# Patient Record
Sex: Female | Born: 1935 | Race: Black or African American | Hispanic: No | Smoking: Former smoker
Health system: Southern US, Community
[De-identification: ages and names within clinical notes are randomized; demographics above are authoritative.]

## PROBLEM LIST (undated history)

## (undated) DIAGNOSIS — I10 Essential (primary) hypertension: Secondary | ICD-10-CM

## (undated) DIAGNOSIS — G20A1 Parkinson's disease without dyskinesia, without mention of fluctuations: Secondary | ICD-10-CM

## (undated) DIAGNOSIS — M1990 Unspecified osteoarthritis, unspecified site: Secondary | ICD-10-CM

## (undated) DIAGNOSIS — C8590 Non-Hodgkin lymphoma, unspecified, unspecified site: Secondary | ICD-10-CM

## (undated) DIAGNOSIS — G20 Parkinson's disease: Secondary | ICD-10-CM

## (undated) DIAGNOSIS — C801 Malignant (primary) neoplasm, unspecified: Secondary | ICD-10-CM

---

## 2003-12-05 ENCOUNTER — Encounter: Admission: RE | Admit: 2003-12-05 | Discharge: 2003-12-05 | Payer: Self-pay | Admitting: Internal Medicine

## 2003-12-10 ENCOUNTER — Encounter: Admission: RE | Admit: 2003-12-10 | Discharge: 2003-12-10 | Payer: Self-pay | Admitting: Internal Medicine

## 2004-11-04 ENCOUNTER — Ambulatory Visit: Payer: Self-pay | Admitting: Internal Medicine

## 2004-12-24 ENCOUNTER — Ambulatory Visit: Payer: Self-pay | Admitting: Gastroenterology

## 2005-05-09 ENCOUNTER — Emergency Department: Payer: Self-pay | Admitting: Emergency Medicine

## 2005-05-30 ENCOUNTER — Ambulatory Visit: Payer: Self-pay | Admitting: *Deleted

## 2005-07-12 ENCOUNTER — Ambulatory Visit: Payer: Self-pay | Admitting: Ophthalmology

## 2005-07-27 ENCOUNTER — Ambulatory Visit: Payer: Self-pay

## 2005-11-15 ENCOUNTER — Ambulatory Visit: Payer: Self-pay | Admitting: Internal Medicine

## 2005-11-29 ENCOUNTER — Ambulatory Visit: Payer: Self-pay | Admitting: Internal Medicine

## 2005-12-07 ENCOUNTER — Ambulatory Visit: Payer: Self-pay | Admitting: Internal Medicine

## 2006-01-25 ENCOUNTER — Ambulatory Visit: Payer: Self-pay | Admitting: Anesthesiology

## 2006-02-14 ENCOUNTER — Ambulatory Visit: Payer: Self-pay | Admitting: Anesthesiology

## 2006-03-14 ENCOUNTER — Ambulatory Visit: Payer: Self-pay | Admitting: Anesthesiology

## 2006-04-17 ENCOUNTER — Ambulatory Visit: Payer: Self-pay | Admitting: Anesthesiology

## 2006-05-31 ENCOUNTER — Ambulatory Visit: Payer: Self-pay | Admitting: Anesthesiology

## 2006-06-26 ENCOUNTER — Ambulatory Visit: Payer: Self-pay | Admitting: Anesthesiology

## 2006-08-17 ENCOUNTER — Ambulatory Visit: Payer: Self-pay | Admitting: Anesthesiology

## 2006-09-26 ENCOUNTER — Ambulatory Visit: Payer: Self-pay | Admitting: Anesthesiology

## 2006-11-17 ENCOUNTER — Ambulatory Visit: Payer: Self-pay | Admitting: Internal Medicine

## 2007-04-25 ENCOUNTER — Emergency Department: Payer: Self-pay | Admitting: Emergency Medicine

## 2007-04-25 ENCOUNTER — Other Ambulatory Visit: Payer: Self-pay

## 2007-05-11 ENCOUNTER — Ambulatory Visit: Payer: Self-pay | Admitting: Internal Medicine

## 2007-08-07 ENCOUNTER — Ambulatory Visit: Payer: Self-pay | Admitting: Vascular Surgery

## 2007-09-25 ENCOUNTER — Ambulatory Visit: Payer: Self-pay | Admitting: Vascular Surgery

## 2007-11-20 ENCOUNTER — Ambulatory Visit: Payer: Self-pay | Admitting: Internal Medicine

## 2007-12-20 ENCOUNTER — Ambulatory Visit: Payer: Self-pay | Admitting: Pain Medicine

## 2007-12-26 ENCOUNTER — Ambulatory Visit: Payer: Self-pay | Admitting: Pain Medicine

## 2008-01-21 ENCOUNTER — Ambulatory Visit: Payer: Self-pay | Admitting: Pain Medicine

## 2008-02-19 ENCOUNTER — Ambulatory Visit: Payer: Self-pay | Admitting: Pain Medicine

## 2008-03-18 ENCOUNTER — Ambulatory Visit: Payer: Self-pay | Admitting: Pain Medicine

## 2008-04-07 ENCOUNTER — Ambulatory Visit: Payer: Self-pay | Admitting: Internal Medicine

## 2008-04-15 ENCOUNTER — Ambulatory Visit: Payer: Self-pay | Admitting: Pain Medicine

## 2008-04-21 ENCOUNTER — Ambulatory Visit: Payer: Self-pay | Admitting: Pain Medicine

## 2008-05-27 ENCOUNTER — Ambulatory Visit: Payer: Self-pay | Admitting: Pain Medicine

## 2008-06-02 ENCOUNTER — Ambulatory Visit: Payer: Self-pay | Admitting: Pain Medicine

## 2008-07-01 ENCOUNTER — Ambulatory Visit: Payer: Self-pay | Admitting: Pain Medicine

## 2008-07-07 ENCOUNTER — Ambulatory Visit: Payer: Self-pay | Admitting: Pain Medicine

## 2008-07-17 ENCOUNTER — Ambulatory Visit: Payer: Self-pay | Admitting: Gastroenterology

## 2008-07-24 ENCOUNTER — Ambulatory Visit: Payer: Self-pay | Admitting: Pain Medicine

## 2008-08-23 ENCOUNTER — Emergency Department: Payer: Self-pay | Admitting: Internal Medicine

## 2008-10-29 ENCOUNTER — Ambulatory Visit: Payer: Self-pay | Admitting: Pain Medicine

## 2008-11-12 ENCOUNTER — Ambulatory Visit: Payer: Self-pay | Admitting: Pain Medicine

## 2008-11-20 ENCOUNTER — Ambulatory Visit: Payer: Self-pay | Admitting: Internal Medicine

## 2008-12-17 ENCOUNTER — Ambulatory Visit: Payer: Self-pay | Admitting: Pain Medicine

## 2009-01-20 ENCOUNTER — Ambulatory Visit: Payer: Self-pay | Admitting: Pain Medicine

## 2009-01-28 ENCOUNTER — Ambulatory Visit: Payer: Self-pay | Admitting: Pain Medicine

## 2009-02-06 ENCOUNTER — Ambulatory Visit: Payer: Self-pay | Admitting: Internal Medicine

## 2009-03-03 ENCOUNTER — Ambulatory Visit: Payer: Self-pay | Admitting: Pain Medicine

## 2009-03-16 ENCOUNTER — Ambulatory Visit: Payer: Self-pay | Admitting: Pain Medicine

## 2009-03-18 ENCOUNTER — Ambulatory Visit: Payer: Self-pay | Admitting: Pain Medicine

## 2009-04-21 ENCOUNTER — Ambulatory Visit: Payer: Self-pay | Admitting: Pain Medicine

## 2009-04-27 ENCOUNTER — Ambulatory Visit: Payer: Self-pay | Admitting: Pain Medicine

## 2009-05-26 ENCOUNTER — Ambulatory Visit: Payer: Self-pay | Admitting: Pain Medicine

## 2009-06-30 ENCOUNTER — Ambulatory Visit: Payer: Self-pay | Admitting: Pain Medicine

## 2009-07-08 ENCOUNTER — Ambulatory Visit: Payer: Self-pay | Admitting: Pain Medicine

## 2009-07-17 ENCOUNTER — Encounter: Payer: Self-pay | Admitting: Neurology

## 2009-07-21 ENCOUNTER — Encounter: Payer: Self-pay | Admitting: Neurology

## 2009-08-11 ENCOUNTER — Ambulatory Visit: Payer: Self-pay | Admitting: Pain Medicine

## 2009-08-12 ENCOUNTER — Ambulatory Visit: Payer: Self-pay | Admitting: Neurology

## 2009-08-21 ENCOUNTER — Encounter: Payer: Self-pay | Admitting: Neurology

## 2009-09-08 ENCOUNTER — Ambulatory Visit: Payer: Self-pay | Admitting: Pain Medicine

## 2009-09-16 ENCOUNTER — Ambulatory Visit: Payer: Self-pay | Admitting: Neurology

## 2009-10-06 ENCOUNTER — Ambulatory Visit: Payer: Self-pay | Admitting: Pain Medicine

## 2009-11-24 ENCOUNTER — Ambulatory Visit: Payer: Self-pay | Admitting: Internal Medicine

## 2009-11-30 ENCOUNTER — Ambulatory Visit: Payer: Self-pay | Admitting: Pain Medicine

## 2010-01-05 ENCOUNTER — Ambulatory Visit: Payer: Self-pay | Admitting: Pain Medicine

## 2010-01-31 ENCOUNTER — Inpatient Hospital Stay: Payer: Self-pay | Admitting: Internal Medicine

## 2010-05-01 ENCOUNTER — Emergency Department: Payer: Self-pay | Admitting: Emergency Medicine

## 2010-11-09 ENCOUNTER — Ambulatory Visit: Payer: Self-pay | Admitting: Rheumatology

## 2010-12-07 ENCOUNTER — Ambulatory Visit: Payer: Self-pay | Admitting: Internal Medicine

## 2010-12-11 ENCOUNTER — Ambulatory Visit: Payer: Self-pay | Admitting: Otolaryngology

## 2010-12-27 ENCOUNTER — Encounter: Payer: Self-pay | Admitting: Otolaryngology

## 2011-01-22 ENCOUNTER — Encounter: Payer: Self-pay | Admitting: Otolaryngology

## 2011-02-21 ENCOUNTER — Encounter: Payer: Self-pay | Admitting: Otolaryngology

## 2011-03-24 ENCOUNTER — Encounter: Payer: Self-pay | Admitting: Otolaryngology

## 2011-04-23 ENCOUNTER — Encounter: Payer: Self-pay | Admitting: Otolaryngology

## 2011-05-24 ENCOUNTER — Encounter: Payer: Self-pay | Admitting: Otolaryngology

## 2011-12-08 ENCOUNTER — Ambulatory Visit: Payer: Self-pay | Admitting: Internal Medicine

## 2012-12-11 ENCOUNTER — Ambulatory Visit: Payer: Self-pay | Admitting: Internal Medicine

## 2013-05-15 ENCOUNTER — Encounter: Payer: Self-pay | Admitting: Family Medicine

## 2013-12-25 ENCOUNTER — Ambulatory Visit: Payer: Self-pay | Admitting: Family Medicine

## 2014-10-22 ENCOUNTER — Other Ambulatory Visit: Payer: Self-pay | Admitting: Family Medicine

## 2014-10-22 DIAGNOSIS — Z1231 Encounter for screening mammogram for malignant neoplasm of breast: Secondary | ICD-10-CM

## 2014-10-26 ENCOUNTER — Emergency Department: Payer: Medicare Other

## 2014-10-26 ENCOUNTER — Encounter: Payer: Self-pay | Admitting: Emergency Medicine

## 2014-10-26 ENCOUNTER — Emergency Department
Admission: EM | Admit: 2014-10-26 | Discharge: 2014-10-26 | Disposition: A | Discharge status: Home / Self Care | Payer: Medicare Other | Attending: Emergency Medicine | Admitting: Emergency Medicine

## 2014-10-26 DIAGNOSIS — Y9222 Religious institution as the place of occurrence of the external cause: Secondary | ICD-10-CM | POA: Diagnosis not present

## 2014-10-26 DIAGNOSIS — Y998 Other external cause status: Secondary | ICD-10-CM | POA: Insufficient documentation

## 2014-10-26 DIAGNOSIS — S0083XA Contusion of other part of head, initial encounter: Secondary | ICD-10-CM | POA: Diagnosis not present

## 2014-10-26 DIAGNOSIS — Y9389 Activity, other specified: Secondary | ICD-10-CM | POA: Insufficient documentation

## 2014-10-26 DIAGNOSIS — I10 Essential (primary) hypertension: Secondary | ICD-10-CM | POA: Insufficient documentation

## 2014-10-26 DIAGNOSIS — W01198A Fall on same level from slipping, tripping and stumbling with subsequent striking against other object, initial encounter: Secondary | ICD-10-CM | POA: Insufficient documentation

## 2014-10-26 DIAGNOSIS — S199XXA Unspecified injury of neck, initial encounter: Secondary | ICD-10-CM | POA: Insufficient documentation

## 2014-10-26 DIAGNOSIS — S0990XA Unspecified injury of head, initial encounter: Secondary | ICD-10-CM | POA: Diagnosis present

## 2014-10-26 DIAGNOSIS — S0093XA Contusion of unspecified part of head, initial encounter: Secondary | ICD-10-CM

## 2014-10-26 HISTORY — DX: Parkinson's disease: G20

## 2014-10-26 HISTORY — DX: Parkinson's disease without dyskinesia, without mention of fluctuations: G20.A1

## 2014-10-26 HISTORY — DX: Essential (primary) hypertension: I10

## 2014-10-26 NOTE — ED Provider Notes (Signed)
[facility] Emergency Department Provider Note  ____________________________________________  Time seen: Approximately 5:14 PM  I have reviewed the triage vital signs and the nursing notes.   HISTORY  Chief Complaint Fall    HPI Isabel Paisly Fingerhut is a 81 y.o. female who presents to the ER for evaluation of the head and neck injury after falling in church this morning. Patient reports no loss of consciousness however she she states that when she fell she hit the side of her head and neck hard on the wall. Denies any numbness tingling. No visual changes. No nausea vomiting. Eyes any dizziness.   Past Medical History  Diagnosis Date  . Hypertension   . Parkinson's disease     There are no active problems to display for this patient.   History reviewed. No pertinent past surgical history.  No current outpatient prescriptions on file.  Allergies Review of patient's allergies indicates no known allergies.  No family history on file.  Social History History  Substance Use Topics  . Smoking status: Never Smoker   . Smokeless tobacco: Not on file  . Alcohol Use: No    Review of Systems Constitutional: No fever/chills Eyes: No visual changes. ENT: No sore throat. Cardiovascular: Denies chest pain. Respiratory: Denies shortness of breath. Gastrointestinal: No abdominal pain.  No nausea, no vomiting.  No diarrhea.  No constipation. Genitourinary: Negative for dysuria. Musculoskeletal: Negative for back pain. Skin: Negative for rash. Neurological: Negative for headaches, focal weakness or numbness.   10-point ROS otherwise negative.  ____________________________________________   PHYSICAL EXAM:  VITAL SIGNS: ED Triage Vitals  Enc Vitals Group     BP 10/26/14 1457 133/60 mmHg     Pulse Rate 10/26/14 1457 83     Resp 10/26/14 1457 18     Temp 10/26/14 1457 98 F (36.7 C)     Temp Source 10/26/14 1457 Oral     SpO2 10/26/14 1457 96 %      Weight 10/26/14 1457 165 lb (74.844 kg)     Height 10/26/14 1457 5\' 3"  (1.6 m)     Head Cir --      Peak Flow --      Pain Score 10/26/14 1458 5     Pain Loc --      Pain Edu? --      Excl. in [place]? --     Constitutional: Alert and oriented. Well appearing and in no acute distress. Eyes: Conjunctivae are normal. PERRL. EOMI. Head: Atraumatic. Nose: No congestion/rhinnorhea. Mouth/Throat: Mucous membranes are moist.  Oropharynx non-erythematous. Neck: No stridor.  No cervical spine tenderness. Cardiovascular: Normal rate, regular rhythm. Grossly normal heart sounds.  Good peripheral circulation. Respiratory: Normal respiratory effort.  No retractions. Lungs CTAB. Gastrointestinal: Soft and nontender. No distention. No abdominal bruits. No CVA tenderness. Musculoskeletal: No lower extremity tenderness nor edema.  No joint effusions. Neurologic:  Normal speech and language. No gross focal neurologic deficits are appreciated. Speech is normal. No gait instability. Skin:  Skin is warm, dry and intact. No rash noted. Psychiatric: Mood and affect are normal. Speech and behavior are normal.  ____________________________________________   LABS (all labs ordered are listed, but only abnormal results are displayed)  Labs Reviewed - No data to display ____________________________________________  EKG  Not applicable ____________________________________________  RADIOLOGY  Head CT and cervical CT negative. ____________________________________________   PROCEDURES  Procedure(s) performed: None  Critical Care performed: No  ____________________________________________   INITIAL IMPRESSION / ASSESSMENT AND PLAN / ED COURSE  Pertinent labs & imaging results that were available during my care of the patient were reviewed by me and considered in my medical decision making (see chart for details).  Discussed clinical findings with the patient, provided reassurance based on  radiological findings. She voices no other emergency medical complaints at this time. She has remained stable during the entire ER stay. Will discharge patient home to follow-up with her PCP. Patient understands to return if symptoms worsen. ____________________________________________   FINAL CLINICAL IMPRESSION(S) / ED DIAGNOSES  Final diagnoses:  None      Arlyss Repress, PA-C 10/26/14 1841  Orbie Pyo, MD 10/26/14 [phone]

## 2014-10-26 NOTE — Discharge Instructions (Signed)

## 2014-10-26 NOTE — ED Notes (Signed)
Pt states she fell while at church today. Pt states she has a hx of falling. Pt states she fell against a wall and hit the left side of head. No loc.

## 2014-10-26 NOTE — ED Notes (Signed)
Pt states she fell this am at church and hit the left side of head. No laceration noted.  No distress noted.

## 2014-12-29 ENCOUNTER — Ambulatory Visit: Payer: Self-pay | Attending: Family Medicine

## 2015-07-21 ENCOUNTER — Encounter: Payer: Self-pay | Admitting: Emergency Medicine

## 2015-07-21 ENCOUNTER — Emergency Department
Admission: EM | Admit: 2015-07-21 | Discharge: 2015-07-21 | Disposition: A | Discharge status: Home / Self Care | Payer: Medicare Other | Attending: Emergency Medicine | Admitting: Emergency Medicine

## 2015-07-21 DIAGNOSIS — M5431 Sciatica, right side: Secondary | ICD-10-CM | POA: Insufficient documentation

## 2015-07-21 DIAGNOSIS — I10 Essential (primary) hypertension: Secondary | ICD-10-CM | POA: Diagnosis not present

## 2015-07-21 DIAGNOSIS — M25551 Pain in right hip: Secondary | ICD-10-CM | POA: Diagnosis present

## 2015-07-21 DIAGNOSIS — G8929 Other chronic pain: Secondary | ICD-10-CM | POA: Diagnosis not present

## 2015-07-21 MED ORDER — HYDROCODONE-ACETAMINOPHEN 5-325 MG PO TABS
1.0000 | ORAL_TABLET | ORAL | Status: DC | PRN
Start: 2015-07-21 — End: 2017-08-21

## 2015-07-21 NOTE — ED Provider Notes (Signed)
[facility] Emergency Department Provider Note  ____________________________________________  Time seen: On arrival  I have reviewed the triage vital signs and the nursing notes.   HISTORY  Chief Complaint Hip Pain and Back Pain    HPI Susan Villegas is a 82 y.o. female who presents with complaints of chronic back pain with radiation into the right hip and leg. Patient reports she has chronic back pain and every once in a while she develops pain that shoots into her right leg. This is been occurring over the last several days. She has seen pain management in the past but was not happy with the injections. She said the only thing that works for her when this happens is "strong pain medication ". She denies muscle weakness. No fevers. No injury. She ambulates with a cane    Past Medical History  Diagnosis Date  . Hypertension   . Parkinson's disease ([place])     There are no active problems to display for this patient.   History reviewed. No pertinent past surgical history.  Current Outpatient Rx  Name  Route  Sig  Dispense  Refill  . HYDROcodone-acetaminophen (NORCO/VICODIN) 5-325 MG tablet   Oral   Take 1 tablet by mouth every 4 (four) hours as needed for moderate pain.   20 tablet   0     Allergies Review of patient's allergies indicates no known allergies.  No family history on file.  Social History Social History  Substance Use Topics  . Smoking status: Never Smoker   . Smokeless tobacco: None  . Alcohol Use: No    Review of Systems  Constitutional: Negative for fever.  ENT: Negative for neck pain   Genitourinary: Negative for incontinence Musculoskeletal: As above Skin: Negative for rash. Neurological: Negative for headaches   ____________________________________________   PHYSICAL EXAM:  VITAL SIGNS: ED Triage Vitals  Enc Vitals Group     BP 07/21/15 1108 133/64 mmHg     Pulse Rate 07/21/15 1108 91     Resp  07/21/15 1108 16     Temp 07/21/15 1108 98.1 F (36.7 C)     Temp Source 07/21/15 1108 Oral     SpO2 07/21/15 1108 97 %     Weight 07/21/15 1108 170 lb (77.111 kg)     Height 07/21/15 1108 5\' 2"  (1.575 m)     Head Cir --      Peak Flow --      Pain Score 07/21/15 1110 0     Pain Loc --      Pain Edu? --      Excl. in [place]? --      Constitutional: Alert and oriented. Well appearing and in no distress. Eyes: Conjunctivae are normal.  ENT   Head: Normocephalic and atraumatic.   Mouth/Throat: Mucous membranes are moist. Cardiovascular: Normal rate, regular rhythm.  Respiratory: Normal respiratory effort without tachypnea nor retractions.  Gastrointestinal: Soft and non-tender in all quadrants. No distention. There is no CVA tenderness. Musculoskeletal: No vertebral tenderness to palpation. Lumbar surgical scar from prior procedure. Patient is at baseline strength in both lower extremities Neurologic:  Normal speech and language. No gross focal neurologic deficits are appreciated. Skin:  Skin is warm, dry and intact. No rash noted. Psychiatric: Mood and affect are normal. Patient exhibits appropriate insight and judgment.  ____________________________________________    LABS (pertinent positives/negatives)  Labs Reviewed - No data to display  ____________________________________________     ____________________________________________    RADIOLOGY  I have personally reviewed any xrays that were ordered on this patient: None  ____________________________________________   PROCEDURES  Procedure(s) performed: none   ____________________________________________   INITIAL IMPRESSION / ASSESSMENT AND PLAN / ED COURSE  Pertinent labs & imaging results that were available during my care of the patient were reviewed by me and considered in my medical decision making (see chart for details).  Patient well-appearing and in no distress. She is ambulating with her  cane well. She appears to be some describing sciatic type pain. She is taking naproxen with some relief. I will prescribe Vicodin to take only as needed for severe pain. She has outpatient follow-up arranged. Return precautions discussed  ____________________________________________   FINAL CLINICAL IMPRESSION(S) / ED DIAGNOSES  Final diagnoses:  Sciatica of right side     Lavonia Drafts, MD 07/21/15 1413

## 2015-07-21 NOTE — ED Notes (Addendum)
C/o a "catch" in right hip.  States pain starts in low back and radiates to right hip and down right leg.  Onset of symptoms Sunday.  Denies injury.  States has had similar pain to right hip in the past that was a "pinched nerve".  States tried taking an Alleve yesterday, that did not help pain.  Patient tried to see PCP today and then went to Urgent Care for same complaint.  Patient states "I want a shot in my joint".

## 2015-07-21 NOTE — Discharge Instructions (Signed)

## 2016-03-14 ENCOUNTER — Other Ambulatory Visit: Payer: Self-pay | Admitting: Otolaryngology

## 2016-03-14 DIAGNOSIS — R221 Localized swelling, mass and lump, neck: Secondary | ICD-10-CM

## 2016-03-17 ENCOUNTER — Ambulatory Visit
Admission: RE | Admit: 2016-03-17 | Discharge: 2016-03-17 | Disposition: A | Discharge status: Home / Self Care | Payer: Medicare Other | Source: Ambulatory Visit | Attending: Otolaryngology | Admitting: Otolaryngology

## 2016-03-17 DIAGNOSIS — R221 Localized swelling, mass and lump, neck: Secondary | ICD-10-CM | POA: Insufficient documentation

## 2016-03-18 ENCOUNTER — Other Ambulatory Visit: Payer: Self-pay | Admitting: Otolaryngology

## 2016-03-18 DIAGNOSIS — R221 Localized swelling, mass and lump, neck: Secondary | ICD-10-CM

## 2016-03-31 ENCOUNTER — Ambulatory Visit
Admission: RE | Admit: 2016-03-31 | Discharge: 2016-03-31 | Disposition: A | Discharge status: Home / Self Care | Payer: Medicare Other | Source: Ambulatory Visit | Attending: Otolaryngology | Admitting: Otolaryngology

## 2016-03-31 DIAGNOSIS — R221 Localized swelling, mass and lump, neck: Secondary | ICD-10-CM | POA: Diagnosis present

## 2016-03-31 LAB — POCT I-STAT CREATININE: CREATININE: 1.4 mg/dL — AB (ref 0.44–1.00)

## 2016-03-31 MED ORDER — IOPAMIDOL (ISOVUE-300) INJECTION 61%
60.0000 mL | Freq: Once | INTRAVENOUS | Status: AC | PRN
  Administered 2016-03-31: 60 mL via INTRAVENOUS

## 2016-12-29 ENCOUNTER — Emergency Department: Payer: Medicare Other

## 2016-12-29 ENCOUNTER — Emergency Department
Admission: EM | Admit: 2016-12-29 | Discharge: 2016-12-29 | Disposition: A | Discharge status: Home / Self Care | Payer: Medicare Other | Attending: Student in an Organized Health Care Education/Training Program | Admitting: Student in an Organized Health Care Education/Training Program

## 2016-12-29 ENCOUNTER — Encounter: Payer: Self-pay | Admitting: Emergency Medicine

## 2016-12-29 DIAGNOSIS — G20 Parkinson's disease: Secondary | ICD-10-CM | POA: Insufficient documentation

## 2016-12-29 DIAGNOSIS — M25551 Pain in right hip: Secondary | ICD-10-CM | POA: Diagnosis not present

## 2016-12-29 DIAGNOSIS — I10 Essential (primary) hypertension: Secondary | ICD-10-CM | POA: Diagnosis not present

## 2016-12-29 HISTORY — DX: Unspecified osteoarthritis, unspecified site: M19.90

## 2016-12-29 MED ORDER — LIDOCAINE 5 % EX PTCH: 1.0000 | MEDICATED_PATCH | Freq: Two times a day (BID) | CUTANEOUS | 0 refills | Status: AC

## 2016-12-29 MED ORDER — LIDOCAINE 5 % EX PTCH
1.0000 | MEDICATED_PATCH | CUTANEOUS | Status: DC
  Administered 2016-12-29: 1 via TRANSDERMAL
  Filled 2016-12-29: qty 1

## 2016-12-29 MED ORDER — ACETAMINOPHEN 325 MG PO TABS
650.0000 mg | ORAL_TABLET | Freq: Once | ORAL | Status: AC
  Administered 2016-12-29: 650 mg via ORAL
  Filled 2016-12-29: qty 2

## 2016-12-29 NOTE — ED Triage Notes (Signed)
Pt arrived to triage in wheelchair. Pt reports shooting pain into right hip that radiates into right leg. Pt sts she has HX of arthritis in the affected hip. Pt denies injury/fall.

## 2016-12-29 NOTE — ED Provider Notes (Signed)
[facility] Emergency Department Provider Note    First MD Initiated Contact with Patient 12/29/16 [phone]     (approximate)  I have reviewed the triage vital signs and the nursing notes.   HISTORY  Chief Complaint Hip Pain    HPI Susan Villegas is a 83 y.o. female with a history of arthritis presents with chief complaint of worsening right hip pain became acutely worse this evening after the patient was getting in he bed. Denies any falls. No trauma. Has been able to ambulate using her walker. She took some Aleve at home without any improvement in her symptoms. Denies any fevers.   Past Medical History:  Diagnosis Date  . Arthritis   . Hypertension   . Parkinson's disease ([place])    History reviewed. No pertinent family history. History reviewed. No pertinent surgical history. There are no active problems to display for this patient.     Prior to Admission medications   Medication Sig Start Date End Date Taking? Authorizing Provider  HYDROcodone-acetaminophen (NORCO/VICODIN) 5-325 MG tablet Take 1 tablet by mouth every 4 (four) hours as needed for moderate pain. 07/21/15   Lavonia Drafts, MD  lidocaine (LIDODERM) 5 % Place 1 patch onto the skin every 12 (twelve) hours. Remove & Discard patch within 12 hours or as directed by MD 12/29/16 12/29/17  Merlyn Lot, MD    Allergies Patient has no known allergies.    Social History Social History  Substance Use Topics  . Smoking status: Never Smoker  . Smokeless tobacco: Never Used  . Alcohol use No    Review of Systems Patient denies headaches, rhinorrhea, blurry vision, numbness, shortness of breath, chest pain, edema, cough, abdominal pain, nausea, vomiting, diarrhea, dysuria, fevers, rashes or hallucinations unless otherwise stated above in HPI. ____________________________________________   PHYSICAL EXAM:  VITAL SIGNS: Vitals:   12/29/16 0103 12/29/16 0323  BP: 125/65 (!) 131/93   Pulse: 97 83  Resp: 17 16  Temp: 97.7 F (36.5 C)     Constitutional: Alert and oriented. Well appearing and in no acute distress. Eyes: Conjunctivae are normal.  Head: Atraumatic. Nose: No congestion/rhinnorhea. Mouth/Throat: Mucous membranes are moist.   Neck: No stridor. Painless ROM.  Cardiovascular: Normal rate, regular rhythm. Grossly normal heart sounds.  Good peripheral circulation. Respiratory: Normal respiratory effort.  No retractions. Lungs CTAB. Gastrointestinal: Soft and nontender. No distention. No abdominal bruits. No CVA tenderness. Musculoskeletal: No lower extremity tenderness nor edema.  Mild ttp over right greater trochanter.  No pain with log roll.  No pain with internal and external rotation or flexion or extension of hip.  No joint effusions. Neurologic:  Normal speech and language. No gross focal neurologic deficits are appreciated. No facial droop Skin:  Skin is warm, dry and intact. No rash noted. Psychiatric: Mood and affect are normal. Speech and behavior are normal.  ____________________________________________   LABS (all labs ordered are listed, but only abnormal results are displayed)  No results found for this or any previous visit (from the past 24 hour(s)). ____________________________________________  ____________________________________________  [id number]  I personally reviewed all radiographic images ordered to evaluate for the above acute complaints and reviewed radiology reports and findings.  These findings were personally discussed with the patient.  Please see medical record for radiology report.  ____________________________________________   PROCEDURES  Procedure(s) performed:  Procedures    Critical Care performed: no ____________________________________________   INITIAL IMPRESSION / ASSESSMENT AND PLAN / ED COURSE  Pertinent labs & imaging  results that were available during my care of the patient were reviewed by me  and considered in my medical decision making (see chart for details).  DDX: arthritis, fracture, bursitis  Susan Villegas is a 83 y.o. who presents to the ED with acute right hip pain. Denies any other injuries. Denies head injury or neck pain/injury. Denies motor or sensory loss in extremity. VSS in ED. Exam as above. NV intact throughout and distal to injury. 2+ distal pulses. Pt able to range joint, with some pain; also TTP at hip joint. No clinical suspicion for infectious process or septic joint. Getting X-rays to r/o fracture. No other injuries reported or noted on exam.  X-rays with no evidence of fracture.  Is not consistent with bursitis. Will give patient a topical analgesia and follow-up with orthopedics.       ____________________________________________   FINAL CLINICAL IMPRESSION(S) / ED DIAGNOSES  Final diagnoses:  Acute pain of right hip      NEW MEDICATIONS STARTED DURING THIS VISIT:  New Prescriptions   LIDOCAINE (LIDODERM) 5 %    Place 1 patch onto the skin every 12 (twelve) hours. Remove & Discard patch within 12 hours or as directed by MD     Note:  This document was prepared using Dragon voice recognition software and may include unintentional dictation errors.]   Merlyn Lot, MD 12/29/16 0410

## 2017-05-14 ENCOUNTER — Encounter: Payer: Self-pay | Admitting: Emergency Medicine

## 2017-05-14 ENCOUNTER — Emergency Department: Payer: Medicare Other

## 2017-05-14 ENCOUNTER — Emergency Department
Admission: EM | Admit: 2017-05-14 | Discharge: 2017-05-14 | Disposition: A | Discharge status: Home / Self Care | Payer: Medicare Other | Attending: Emergency Medicine | Admitting: Emergency Medicine

## 2017-05-14 DIAGNOSIS — W1809XA Striking against other object with subsequent fall, initial encounter: Secondary | ICD-10-CM | POA: Insufficient documentation

## 2017-05-14 DIAGNOSIS — G20 Parkinson's disease: Secondary | ICD-10-CM | POA: Diagnosis not present

## 2017-05-14 DIAGNOSIS — S0990XA Unspecified injury of head, initial encounter: Secondary | ICD-10-CM | POA: Diagnosis present

## 2017-05-14 DIAGNOSIS — E876 Hypokalemia: Secondary | ICD-10-CM | POA: Diagnosis not present

## 2017-05-14 DIAGNOSIS — I10 Essential (primary) hypertension: Secondary | ICD-10-CM | POA: Diagnosis not present

## 2017-05-14 DIAGNOSIS — R9431 Abnormal electrocardiogram [ECG] [EKG]: Secondary | ICD-10-CM | POA: Insufficient documentation

## 2017-05-14 DIAGNOSIS — Z79899 Other long term (current) drug therapy: Secondary | ICD-10-CM | POA: Insufficient documentation

## 2017-05-14 DIAGNOSIS — Y999 Unspecified external cause status: Secondary | ICD-10-CM | POA: Diagnosis not present

## 2017-05-14 DIAGNOSIS — Y9301 Activity, walking, marching and hiking: Secondary | ICD-10-CM | POA: Diagnosis not present

## 2017-05-14 DIAGNOSIS — S0083XA Contusion of other part of head, initial encounter: Secondary | ICD-10-CM | POA: Diagnosis not present

## 2017-05-14 DIAGNOSIS — W19XXXA Unspecified fall, initial encounter: Secondary | ICD-10-CM

## 2017-05-14 DIAGNOSIS — Y92009 Unspecified place in unspecified non-institutional (private) residence as the place of occurrence of the external cause: Secondary | ICD-10-CM | POA: Insufficient documentation

## 2017-05-14 LAB — BASIC METABOLIC PANEL
ANION GAP: 8 (ref 5–15)
BUN: 18 mg/dL (ref 6–20)
CALCIUM: 9 mg/dL (ref 8.9–10.3)
CO2: 25 mmol/L (ref 22–32)
Chloride: 106 mmol/L (ref 101–111)
Creatinine, Ser: 1.22 mg/dL — ABNORMAL HIGH (ref 0.44–1.00)
GFR calc non Af Amer: 38 mL/min — ABNORMAL LOW (ref >60)
GFR, EST AFRICAN AMERICAN: 44 mL/min — AB (ref >60)
Glucose, Bld: 108 mg/dL — ABNORMAL HIGH (ref 65–99)
POTASSIUM: 2.8 mmol/L — AB (ref 3.5–5.1)
Sodium: 139 mmol/L (ref 135–145)

## 2017-05-14 LAB — CBC
HEMATOCRIT: 37 % (ref 35.0–47.0)
HEMOGLOBIN: 12.1 g/dL (ref 12.0–16.0)
MCH: 30.1 pg (ref 26.0–34.0)
MCHC: 32.7 g/dL (ref 32.0–36.0)
MCV: 92.2 fL (ref 80.0–100.0)
Platelets: 331 10*3/uL (ref 150–440)
RBC: 4.01 MIL/uL (ref 3.80–5.20)
RDW: 13.8 % (ref 11.5–14.5)
WBC: 5.8 10*3/uL (ref 3.6–11.0)

## 2017-05-14 MED ORDER — POTASSIUM CHLORIDE CRYS ER 20 MEQ PO TBCR
40.0000 meq | EXTENDED_RELEASE_TABLET | Freq: Once | ORAL | Status: AC
  Administered 2017-05-14: 40 meq via ORAL
  Filled 2017-05-14: qty 2

## 2017-05-14 MED ORDER — POTASSIUM CHLORIDE ER 10 MEQ PO TBCR: 40.0000 meq | EXTENDED_RELEASE_TABLET | Freq: Once | ORAL | 0 refills | Status: DC

## 2017-05-14 NOTE — ED Triage Notes (Addendum)
Patient arrives from home with witnessed fall by patients niece.  Patient fell this morning after not sleeping much tonight.  Pt has slight hematoma on right forehead.  Patient is not on blood thinners, and reports a slight headache.  Family states the patient has a hx of dementia.

## 2017-05-14 NOTE — ED Notes (Signed)
Pt assisted out of car to wheelchair; pt says she stumbled and hit her head on a door frame; pt awake and alert

## 2017-05-14 NOTE — ED Provider Notes (Addendum)
[facility] Emergency Department Provider Note  ____________________________________________   First MD Initiated Contact with Patient 05/14/17 [phone]     (approximate)  I have reviewed the triage vital signs and the nursing notes.   HISTORY  Chief Complaint Fall    HPI Susan Villegas is a 83 y.o. female with a history of hypertension as well as Parkinson's disease who is presenting after a fall.  She says that she had gotten up in the middle the night to use the bathroom and walked into a wall and then fell backwards.  She says that the lights were off in the hallway where she was walking and this is likely where she walked into the wall.  She denies losing consciousness.  She was brought in by her son who is concerned about swelling that started after she hit her head against the wall.  The patient is not reporting any numbness or weakness.  Denies any pain in her hips or lower extremities.  Denies taking blood thinners.  Past Medical History:  Diagnosis Date  . Arthritis   . Hypertension   . Parkinson's disease ([place])     There are no active problems to display for this patient.   History reviewed. No pertinent surgical history.  Prior to Admission medications   Medication Sig Start Date End Date Taking? Authorizing Provider  HYDROcodone-acetaminophen (NORCO/VICODIN) 5-325 MG tablet Take 1 tablet by mouth every 4 (four) hours as needed for moderate pain. 07/21/15   Lavonia Drafts, MD  lidocaine (LIDODERM) 5 % Place 1 patch onto the skin every 12 (twelve) hours. Remove & Discard patch within 12 hours or as directed by MD 12/29/16 12/29/17  Merlyn Lot, MD    Allergies Patient has no known allergies.  No family history on file.  Social History Social History   Tobacco Use  . Smoking status: Never Smoker  . Smokeless tobacco: Never Used  Substance Use Topics  . Alcohol use: No  . Drug use: No    Review of Systems  Constitutional:  No fever/chills Eyes: No visual changes. ENT: No sore throat. Cardiovascular: Denies chest pain. Respiratory: Denies shortness of breath. Gastrointestinal: No abdominal pain.  No nausea, no vomiting.  No diarrhea.  No constipation. Genitourinary: Negative for dysuria. Musculoskeletal: Negative for back pain. Skin: Negative for rash. Neurological: Negative for focal weakness or numbness.   ____________________________________________   PHYSICAL EXAM:  VITAL SIGNS: ED Triage Vitals [05/14/17 0621]  Enc Vitals Group     BP (!) 142/71     Pulse Rate 90     Resp 18     Temp (!) 97.5 F (36.4 C)     Temp Source Oral     SpO2 95 %     Weight      Height      Head Circumference      Peak Flow      Pain Score      Pain Loc      Pain Edu?      Excl. in [place]?     Constitutional: Alert and oriented. Well appearing and in no acute distress. Eyes: Conjunctivae are normal.  Head: Slightly raised hematoma over the right forehead.  Hematoma measures about 4 x 6 cm.  No bogginess.  Minimal tenderness to palpation.  No depression. Nose: No congestion/rhinnorhea. Mouth/Throat: Mucous membranes are moist.  Neck: No stridor.  No tenderness to palpation to the midline cervical spine.  No deformity or step-off. Cardiovascular: Normal  rate, regular rhythm. Grossly normal heart sounds.   Respiratory: Normal respiratory effort.  No retractions. Lungs CTAB. Gastrointestinal: Soft and nontender. No distention. No CVA tenderness. Musculoskeletal: No lower extremity tenderness nor edema.  No joint effusions.  5 out of 5 strength to bilateral lower extremity's.  Pelvis is stable without tenderness to the bilateral hips.  No tenderness to palpation to the thoracic nor lumbar spines.  No deformity or step-off. Neurologic:  Normal speech and language. No gross focal neurologic deficits are appreciated. Skin:  Skin is warm, dry and intact. No rash noted. Psychiatric: Mood and affect are normal. Speech and  behavior are normal.  ____________________________________________   LABS (all labs ordered are listed, but only abnormal results are displayed)  Labs Reviewed  BASIC METABOLIC PANEL - Abnormal; Notable for the following components:      Result Value   Potassium 2.8 (*)    Glucose, Bld 108 (*)    Creatinine, Ser 1.22 (*)    GFR calc non Af Amer 38 (*)    GFR calc Af Amer 44 (*)    All other components within normal limits  CBC   ____________________________________________  EKG  ED ECG REPORT I, Doran Stabler, the attending physician, personally viewed and interpreted this ECG.   Date: 05/14/2017  EKG Time: 0632  Rate: 79  Rhythm: normal sinus rhythm  Axis: Normal axis  Intervals:Incomplete right bundle branch block.  ST&T Change: No ST segment elevation or depression.  No abnormal T wave inversion.  ED ECG REPORT I, Doran Stabler, the attending physician, personally viewed and interpreted this ECG.   Date: 05/14/2017  EKG Time: 0851  Rate: 70  Rhythm: normal sinus rhythm  Axis: Normal  Intervals:Prolonged QTC at 523  ST&T Change: No ST segment elevation or depression.  No abnormal T wave inversion.  ____________________________________________  RADIOLOGY  CT head with right frontal scalp hematoma but otherwise no acute intracranial abnormality. ____________________________________________   PROCEDURES  Procedure(s) performed:   Procedures  Critical Care performed:   ____________________________________________   INITIAL IMPRESSION / ASSESSMENT AND PLAN / ED COURSE  Pertinent labs & imaging results that were available during my care of the patient were reviewed by me and considered in my medical decision making (see chart for details).  DDX: Fall, cephalhematoma, contusion, concussion, intrarenal hemorrhage, skull fracture As part of my medical decision making, I reviewed the following data within the electronic MEDICAL RECORD NUMBER Notes from  prior ED visits  Patient without worrisome findings on lab workup nor imaging.  Hypokalemic and the patient says that she used to be on potassium supplementation.  We will give her a dose of potassium in the emergency department.  Also will refer to her primary care doctor for prolonged QTC on the EKG.  However, I do not feel like this is contributing to the patient's current symptomatology.  Explained the need for follow-up and also of the diagnosis and treatment plan.  Patient and family are understanding and willing to comply.   ----------------------------------------- 8:55 AM on 05/14/2017 -----------------------------------------  Patient with repeat EKG with improved QTC.  However, still prolonged.  Patient says that she has been having palpitations for years but is never passed out.  We will replete her potassium.  I still believe this can be treated as an outpatient as the patient is largely asymptomatic at this time and the fall was mechanical.  I explained to her the electrical abnormality that we are saying and that she was follow-up  with her primary care doctor after taking potassium at home for a day as well.  She is understanding of this plan willing to comply. ____________________________________________   FINAL CLINICAL IMPRESSION(S) / ED DIAGNOSES  Fall.  Cephalohematoma.  Prolonged QTC    NEW MEDICATIONS STARTED DURING THIS VISIT:  This SmartLink is deprecated. Use AVSMEDLIST instead to display the medication list for a patient.   Note:  This document was prepared using Dragon voice recognition software and may include unintentional dictation errors.     Orbie Pyo, MD 05/14/17 [phone]    Orbie Pyo, MD 05/14/17 [phone]  I discussed the case with Dr. Sheron Nightingale of cardiology who agrees with outpatient follow-up.  We reviewed her medication list and we agree to not change any medications at this time.    Orbie Pyo, MD 05/14/17  [phone]

## 2017-06-28 ENCOUNTER — Encounter: Payer: Self-pay | Admitting: Emergency Medicine

## 2017-06-28 ENCOUNTER — Other Ambulatory Visit: Payer: Self-pay

## 2017-06-28 ENCOUNTER — Emergency Department: Payer: Medicare Other

## 2017-06-28 DIAGNOSIS — Z79899 Other long term (current) drug therapy: Secondary | ICD-10-CM | POA: Insufficient documentation

## 2017-06-28 DIAGNOSIS — K5900 Constipation, unspecified: Secondary | ICD-10-CM | POA: Diagnosis not present

## 2017-06-28 DIAGNOSIS — E876 Hypokalemia: Secondary | ICD-10-CM | POA: Insufficient documentation

## 2017-06-28 DIAGNOSIS — G20 Parkinson's disease: Secondary | ICD-10-CM | POA: Diagnosis not present

## 2017-06-28 DIAGNOSIS — M533 Sacrococcygeal disorders, not elsewhere classified: Secondary | ICD-10-CM | POA: Diagnosis not present

## 2017-06-28 DIAGNOSIS — I10 Essential (primary) hypertension: Secondary | ICD-10-CM | POA: Diagnosis not present

## 2017-06-28 LAB — CBC WITH DIFFERENTIAL/PLATELET
BASOS ABS: 0.1 10*3/uL (ref 0–0.1)
BASOS PCT: 1 %
Eosinophils Absolute: 0.2 10*3/uL (ref 0–0.7)
Eosinophils Relative: 3 %
HEMATOCRIT: 35 % (ref 35.0–47.0)
Hemoglobin: 11.5 g/dL — ABNORMAL LOW (ref 12.0–16.0)
LYMPHS PCT: 21 %
Lymphs Abs: 1.3 10*3/uL (ref 1.0–3.6)
MCH: 28.9 pg (ref 26.0–34.0)
MCHC: 32.8 g/dL (ref 32.0–36.0)
MCV: 88 fL (ref 80.0–100.0)
Monocytes Absolute: 0.6 10*3/uL (ref 0.2–0.9)
Monocytes Relative: 10 %
NEUTROS PCT: 65 %
Neutro Abs: 4 10*3/uL (ref 1.4–6.5)
Platelets: 403 10*3/uL (ref 150–440)
RBC: 3.98 MIL/uL (ref 3.80–5.20)
RDW: 14.1 % (ref 11.5–14.5)
WBC: 6.1 10*3/uL (ref 3.6–11.0)

## 2017-06-28 LAB — COMPREHENSIVE METABOLIC PANEL
ALBUMIN: 3.5 g/dL (ref 3.5–5.0)
ALT: 8 U/L — AB (ref 14–54)
AST: 18 U/L (ref 15–41)
Alkaline Phosphatase: 70 U/L (ref 38–126)
Anion gap: 13 (ref 5–15)
BILIRUBIN TOTAL: 0.4 mg/dL (ref 0.3–1.2)
BUN: 16 mg/dL (ref 6–20)
CO2: 24 mmol/L (ref 22–32)
CREATININE: 1.04 mg/dL — AB (ref 0.44–1.00)
Calcium: 9.4 mg/dL (ref 8.9–10.3)
Chloride: 102 mmol/L (ref 101–111)
GFR calc Af Amer: 54 mL/min — ABNORMAL LOW (ref >60)
GFR, EST NON AFRICAN AMERICAN: 46 mL/min — AB (ref >60)
GLUCOSE: 136 mg/dL — AB (ref 65–99)
POTASSIUM: 3.1 mmol/L — AB (ref 3.5–5.1)
Sodium: 139 mmol/L (ref 135–145)
TOTAL PROTEIN: 8.5 g/dL — AB (ref 6.5–8.1)

## 2017-06-28 NOTE — ED Triage Notes (Signed)
Pt to triage via w/c with no distress noted; pt c/o fecal impaction with rectal pain; denies any nausea; c/o pain to left side; denies abd or back pain

## 2017-06-29 ENCOUNTER — Ambulatory Visit: Payer: Medicare Other | Admitting: Hematology and Oncology

## 2017-06-29 ENCOUNTER — Emergency Department: Payer: Medicare Other

## 2017-06-29 ENCOUNTER — Emergency Department
Admission: EM | Admit: 2017-06-29 | Discharge: 2017-06-29 | Disposition: A | Discharge status: Home / Self Care | Payer: Medicare Other | Attending: Emergency Medicine | Admitting: Emergency Medicine

## 2017-06-29 DIAGNOSIS — E876 Hypokalemia: Secondary | ICD-10-CM

## 2017-06-29 DIAGNOSIS — R222 Localized swelling, mass and lump, trunk: Secondary | ICD-10-CM

## 2017-06-29 DIAGNOSIS — K5900 Constipation, unspecified: Secondary | ICD-10-CM

## 2017-06-29 DIAGNOSIS — M533 Sacrococcygeal disorders, not elsewhere classified: Secondary | ICD-10-CM

## 2017-06-29 MED ORDER — MAGNESIUM CITRATE PO SOLN
ORAL | Status: AC
  Filled 2017-06-29: qty 296

## 2017-06-29 MED ORDER — MAGNESIUM CITRATE PO SOLN
1.0000 | Freq: Once | ORAL | Status: AC
  Administered 2017-06-29: 1 via ORAL
  Filled 2017-06-29: qty 296

## 2017-06-29 MED ORDER — POTASSIUM CHLORIDE CRYS ER 20 MEQ PO TBCR
40.0000 meq | EXTENDED_RELEASE_TABLET | Freq: Once | ORAL | Status: AC
  Administered 2017-06-29: 40 meq via ORAL

## 2017-06-29 MED ORDER — IOPAMIDOL (ISOVUE-300) INJECTION 61%
100.0000 mL | Freq: Once | INTRAVENOUS | Status: AC | PRN
  Administered 2017-06-29: 100 mL via INTRAVENOUS

## 2017-06-29 MED ORDER — IOPAMIDOL (ISOVUE-300) INJECTION 61%
30.0000 mL | Freq: Once | INTRAVENOUS | Status: AC | PRN
  Administered 2017-06-29: 30 mL via ORAL

## 2017-06-29 MED ORDER — POTASSIUM CHLORIDE CRYS ER 20 MEQ PO TBCR: 20.0000 meq | EXTENDED_RELEASE_TABLET | Freq: Every day | ORAL | 0 refills | Status: DC

## 2017-06-29 MED ORDER — DOCUSATE SODIUM 50 MG/5ML PO LIQD
100.0000 mg | ORAL | Status: AC
  Administered 2017-06-29: 100 mg via ORAL
  Filled 2017-06-29: qty 10

## 2017-06-29 MED ORDER — LACTULOSE 10 GM/15ML PO SOLN
30.0000 g | Freq: Once | ORAL | Status: AC
  Administered 2017-06-29: 30 g via ORAL
  Filled 2017-06-29: qty 60

## 2017-06-29 MED ORDER — MAGNESIUM CITRATE PO SOLN: 1.0000 | Freq: Once | ORAL | Status: DC

## 2017-06-29 NOTE — ED Provider Notes (Signed)
[facility] Emergency Department Provider Note  ____________________________________________   First MD Initiated Contact with Patient 06/29/17 [phone]     (approximate)  I have reviewed the triage vital signs and the nursing notes.   HISTORY  Chief Complaint Fecal Impaction    HPI Susan Villegas is a 84 y.o. female with medical history as listed below who presents for evaluation of constipation over the last 4 days.  It is been gradually worsening and is now severe.  She feels like she has a lot of gas and some pain in her left side from the sensation of bloating.  She has some rectal pain from time to time similar to prior episodes of constipation.  She takes Dulcolax but it is not been helping.  Her doctor said that he plans to start her on MiraLAX but she has not yet taking MiraLAX.  She has a history of constipation and has had this issue multiple times in the past but usually Dulcolax works.  She has some pain in her upper abdomen from the bloating sensation but no other abdominal pain and it comes and goes in waves like cramping.  She denies fever/chills, chest pain, shortness of breath, nausea, and vomiting.  Her last bowel movement was about 4 days ago.  Past Medical History:  Diagnosis Date  . Arthritis   . Hypertension   . Parkinson's disease ([place])     There are no active problems to display for this patient.   History reviewed. No pertinent surgical history.  Prior to Admission medications   Medication Sig Start Date End Date Taking? Authorizing Provider  lidocaine (LIDODERM) 5 % Place 1 patch onto the skin every 12 (twelve) hours. Remove & Discard patch within 12 hours or as directed by MD 12/29/16 12/29/17 Yes Merlyn Lot, MD  DULoxetine (CYMBALTA) 60 MG capsule Take 1 capsule by mouth daily. 06/13/17   [provider]  felodipine (PLENDIL) 5 MG 24 hr tablet Take 1 tablet by mouth 2 (two) times daily. 06/14/17   [provider]  HYDROcodone-acetaminophen (NORCO/VICODIN) 5-325 MG tablet Take 1 tablet by mouth every 4 (four) hours as needed for moderate pain. Patient not taking: Reported on 06/29/2017 07/21/15   Lavonia Drafts, MD  losartan (COZAAR) 25 MG tablet Take 1 tablet by mouth daily. 05/09/17   [provider]  oxybutynin (DITROPAN) 5 MG tablet Take 1 tablet by mouth 2 (two) times daily. 06/17/17   [provider]  polyethylene glycol (MIRALAX / GLYCOLAX) packet Take 17 g by mouth daily. 06/27/17   [provider]  potassium chloride (K-DUR) 10 MEQ tablet Take 4 tablets (40 mEq total) by mouth once for 1 dose. 05/14/17 05/14/17  Schaevitz, Randall An, MD  potassium chloride SA (KLOR-CON M20) 20 MEQ tablet Take 1 tablet (20 mEq total) by mouth daily. 06/29/17   Hinda Kehr, MD  SYNTHROID 150 MCG tablet Take 1 tablet by mouth daily. 06/27/17   [provider]    Allergies Patient has no known allergies.  No family history on file.  Social History Social History   Tobacco Use  . Smoking status: Never Smoker  . Smokeless tobacco: Never Used  Substance Use Topics  . Alcohol use: No  . Drug use: No    Review of Systems Constitutional: No fever/chills Eyes: No visual complaints ENT: No difficulty swallowing Cardiovascular: Denies chest pain. Respiratory: Denies shortness of breath. Gastrointestinal: Bloating, intermittent abdominal "gassy" pain, and some intermittent rectal pain and  pressure consistent with constipation.  No nausea nor vomiting Genitourinary: Negative for dysuria. Musculoskeletal: Negative for neck pain.  Negative for back pain. Integumentary: Negative for rash. Neurological: Negative for headaches, focal weakness or numbness.   ____________________________________________   PHYSICAL EXAM:  VITAL SIGNS: ED Triage Vitals  Enc Vitals Group     BP 06/28/17 2254 126/71     Pulse Rate 06/28/17 2254 (!) 105     Resp 06/28/17 2254 20      Temp 06/28/17 2254 98.6 F (37 C)     Temp Source 06/28/17 2254 Oral     SpO2 06/28/17 2254 97 %     Weight 06/28/17 2253 80.7 kg (178 lb)     Height 06/28/17 2253 1.6 m (5\' 3" )     Head Circumference --      Peak Flow --      Pain Score 06/28/17 2252 8     Pain Loc --      Pain Edu? --      Excl. in [place]? --     Constitutional: Alert and oriented. Well appearing and in no acute distress. Eyes: Conjunctivae are normal.  Head: Atraumatic. Nose: No congestion/rhinnorhea. Mouth/Throat: Mucous membranes are moist. Neck: No stridor.  No meningeal signs.   Cardiovascular: Normal rate, regular rhythm. Good peripheral circulation. Grossly normal heart sounds. Respiratory: Normal respiratory effort.  No retractions. Lungs CTAB. Gastrointestinal: Soft with some possible distention but no tenderness to palpation Rectal:  Patient declined my digital disimpaction Musculoskeletal: No lower extremity tenderness nor edema. No gross deformities of extremities. Neurologic:  Normal speech and language. No gross focal neurologic deficits are appreciated.  Skin:  Skin is warm, dry and intact. No rash noted. Psychiatric: Mood and affect are normal. Speech and behavior are normal.  ____________________________________________   LABS (all labs ordered are listed, but only abnormal results are displayed)  Labs Reviewed  CBC WITH DIFFERENTIAL/PLATELET - Abnormal; Notable for the following components:      Result Value   Hemoglobin 11.5 (*)    All other components within normal limits  COMPREHENSIVE METABOLIC PANEL - Abnormal; Notable for the following components:   Potassium 3.1 (*)    Glucose, Bld 136 (*)    Creatinine, Ser 1.04 (*)    Total Protein 8.5 (*)    ALT 8 (*)    GFR calc non Af Amer 46 (*)    GFR calc Af Amer 54 (*)    All other components within normal limits  URINALYSIS, COMPLETE (UACMP) WITH MICROSCOPIC   ____________________________________________  EKG  None - EKG not  ordered by ED physician ____________________________________________  RADIOLOGY Ursula Alert, personally viewed and evaluated these images (plain radiographs) as part of my medical decision making, as well as reviewing the written report by the radiologist.  ED MD interpretation: Constipation, no large stool ball in rectum and no evidence of obstruction  Official radiology report(s): Dg Abdomen 1 View  Result Date: 06/28/2017 CLINICAL DATA:  Patient complains of rectal pain and fecal impaction. EXAM: ABDOMEN - 1 VIEW COMPARISON:  None. FINDINGS: Moderate fecal loading in the colon, particularly in the ascending colon and proximal transverse colon. No large stool ball seen in the rectum. No bowel obstruction. IMPRESSION: Moderate fecal loading in the colon as above. Electronically Signed   By: Dorise Bullion III M.D   On: 06/28/2017 23:24   Ct Abdomen Pelvis W Contrast  Result Date: 06/29/2017 CLINICAL DATA:  Distended abdomen, constipation EXAM: CT ABDOMEN AND PELVIS WITH  CONTRAST TECHNIQUE: Multidetector CT imaging of the abdomen and pelvis was performed using the standard protocol following bolus administration of intravenous contrast. CONTRAST:  173mL ISOVUE-300 IOPAMIDOL (ISOVUE-300) INJECTION 61% COMPARISON:  KUB 06/28/2017 FINDINGS: Lower chest: Lung bases clear Hepatobiliary: 1 cm cyst in the anterior right lobe liver unchanged from CT of 11/29/2005. Partially distended gallbladder. Common bile duct upper normal at 7 mm. Correlate with liver function test. Mild prominence of the intrahepatic bile ducts. Pancreas: Negative Spleen: Negative Adrenals/Urinary Tract: Normal kidneys without obstruction or mass or stone. Bladder wall thickening Stomach/Bowel: Moderate stool in the right and transverse colon. Negative for bowel obstruction. No bowel mass or edema. Normal appendix. Sigmoid diverticulosis. Negative for diverticulitis. Hiatal hernia Vascular/Lymphatic: Moderate atherosclerotic disease  aorta and iliac arteries without aneurysm or occlusion Reproductive: Normal uterus. Other: None Musculoskeletal: Destructive mass in the right sacrum. Soft tissue mass extends anterior and posterior to the sacral mass. Overall the mass measures approximately 6 x 9 cm. Lumbar spine degenerative changes and spinal stenosis. Anterolisthesis L4-5. No other spinal lesions. IMPRESSION: Mild prominence of the bile ducts. Correlate with liver function tests. Bladder wall thickening Constipation with moderate stool in the right and transverse colon. Negative for bowel obstruction 6 x 9 cm mass in the right sacrum extending anterior and posterior to the sacrum into the soft tissues. This is consistent with neoplasm and could represent myeloma or metastatic disease. Electronically Signed   By: Franchot Gallo M.D.   On: 06/29/2017 07:20    ____________________________________________   PROCEDURES  Critical Care performed: No   Procedure(s) performed:   Procedures   ____________________________________________   INITIAL IMPRESSION / ASSESSMENT AND PLAN / ED COURSE  As part of my medical decision making, I reviewed the following data within the [facility] notes reviewed and incorporated, Labs reviewed , Old chart reviewed and Notes from prior ED visits    Differential diagnosis includes, but is not limited to, constipation, SBO/ileus, intra-abdominal infection, obstruction secondary to neoplasm, etc.  Plain film of the abdomen indicates a significant stool burden with no sign of obstruction and no rectal stool ball.   I offered digital disimpaction but the patient declined in favor an enema which I think is appropriate given the lack of stool ball seen on the radiograph.  We will try a soapsuds enema with magnesium citrate and liquid Colace as well as magnesium citrate by mouth and reassess.  She is in no acute distress and has no clinically significant tenderness to palpation of  the abdomen. Clinical Course as of Jun 29 750  Thu Jun 29, 2017  0542 No success with enema.  Given possibility of obstructive neoplasm or other mechanical obstruction, will obtain CT abd/pelvis wtih oral and IV contrast.  [CF]  0750 The CT scan shows a significant amount of colonic stool, but unfortunately also shows a large sacral mass.  Based on the appearance of the CT scan it does not appear to be obstructing the flow of stool and is unlikely to be the cause of the patient's constipation.  I will give her a dose of lactulose and I counseled her and her son about additional medications she can take at home such as MiraLAX and plenty of fluids to help as an aggressive bowel regimen.  I also informed the patient and her son about the results of the CT scan and the mass and the concern for cancer.  I will send a message to Dr. Mike Gip through [facility] and I  gave the follow-up contact information to the patient and her son and they know to call to schedule the next available follow-up appointment.  I gave my usual and customary return precautions.  She understands and agrees with the plan. CT ABDOMEN PELVIS W CONTRAST [CF]    Clinical Course User Index [CF] Hinda Kehr, MD    ____________________________________________  FINAL CLINICAL IMPRESSION(S) / ED DIAGNOSES  Final diagnoses:  Constipation, unspecified constipation type  Hypokalemia  Sacral mass     MEDICATIONS GIVEN DURING THIS VISIT:  Medications  magnesium citrate solution 1 Bottle (not administered)  lactulose (CHRONULAC) 10 GM/15ML solution 30 g (not administered)  magnesium citrate solution 1 Bottle (1 Bottle Oral Given 06/29/17 0408)  docusate (COLACE) 50 MG/5ML liquid 100 mg (100 mg Oral Given 06/29/17 0407)  iopamidol (ISOVUE-300) 61 % injection 30 mL (30 mLs Oral Contrast Given 06/29/17 0558)  iopamidol (ISOVUE-300) 61 % injection 100 mL (100 mLs Intravenous Contrast Given 06/29/17 0646)  potassium chloride SA (K-DUR,KLOR-CON)  CR tablet 40 mEq (40 mEq Oral Given 06/29/17 0747)     ED Discharge Orders        Ordered    potassium chloride SA (KLOR-CON M20) 20 MEQ tablet  Daily     06/29/17 0748       Note:  This document was prepared using Dragon voice recognition software and may include unintentional dictation errors.    Hinda Kehr, MD 06/29/17 [phone]

## 2017-06-29 NOTE — Discharge Instructions (Signed)
You were seen in the emergency department today for constipation.  We recommend that you use one or more of the following over-the-counter medications in the order described:   1)  Colace (or Dulcolax) 100 mg:  This is a stool softener, and you may take it once or twice a day as needed. 2)  Senna tablets:  This is a bowel stimulant that will help "push" out your stool. It is the next step to add after you have tried a stool softener. 3)  Miralax (powder):  This medication works by drawing additional fluid into your intestines and helps to flush out your stool.  Mix the powder with water or juice according to label instructions.  It may help if the Colace and Senna are not sufficient, but you must be sure to use the recommended amount of water or juice when you mix up the powder. 4)  Look for magnesium citrate at the pharmacy (it is usually a small glass bottle).  Drink the bottle according to the label instructions.  Remember that narcotic pain medications are constipating, so avoid them or minimize their use.  Drink plenty of fluids.  As we discussed, though, through the process of evaluating her constipation, we discovered that you have a large mass in your sacrum which is concerning for cancer.  We recommend you call the office of Dr. Mike Gip at the next available opportunity to schedule a follow-up appointment at the cancer center with her or 1 of her oncology colleagues.   Return to the emergency department if you develop new or worsening symptoms that concern you.

## 2017-06-29 NOTE — ED Notes (Signed)
Pt and primary RN state magnesium citrate previously administered but lactulose has not been given. Will administer lactulose.

## 2017-06-29 NOTE — Progress Notes (Deleted)
[facility] day:  06/29/2017  Chief Complaint: Susan Villegas is a 84 y.o. female with asacral mass who is referred in consultation by Dr. Hinda Kehr for assessment and management.  HPI:  The patient was seen in the [facility] ER on 06/29/2017 with fecal impaction.  She was taking Dulcolax unsuccessfully at home.  She described upper abdominal pain from bloating sensation.  She did not respond to an enema.  CT scan was performed.  She then received Miralax.  Abdomen and pelvic CT on 06/29/2017 revealed a 6 x 9 cm destructive mass in the right sacrum.  Sot tissue extends anterior and posterior to the sacral mass.  This is consistent with neoplasm and could represent myeloma or metastatic disease.   Past Medical History:  Diagnosis Date  . Arthritis   . Hypertension   . Parkinson's disease ([place])     No past surgical history on file.  No family history on file.  Social History:  reports that  has never smoked. she has never used smokeless tobacco. She reports that she does not drink alcohol or use drugs.  The patient is accompanied by *** alone today.  Allergies: No Known Allergies  Current Medications: Current Outpatient Medications  Medication Sig Dispense Refill  . DULoxetine (CYMBALTA) 60 MG capsule Take 1 capsule by mouth daily.  0  . felodipine (PLENDIL) 5 MG 24 hr tablet Take 1 tablet by mouth 2 (two) times daily.  1  . HYDROcodone-acetaminophen (NORCO/VICODIN) 5-325 MG tablet Take 1 tablet by mouth every 4 (four) hours as needed for moderate pain. (Patient not taking: Reported on 06/29/2017) 20 tablet 0  . lidocaine (LIDODERM) 5 % Place 1 patch onto the skin every 12 (twelve) hours. Remove & Discard patch within 12 hours or as directed by MD 10 patch 0  . losartan (COZAAR) 25 MG tablet Take 1 tablet by mouth daily.    Marland Kitchen oxybutynin (DITROPAN) 5 MG tablet Take 1 tablet by mouth 2 (two) times daily.  2  . polyethylene glycol (MIRALAX /  GLYCOLAX) packet Take 17 g by mouth daily.    . potassium chloride (K-DUR) 10 MEQ tablet Take 4 tablets (40 mEq total) by mouth once for 1 dose. 4 tablet 0  . potassium chloride SA (KLOR-CON M20) 20 MEQ tablet Take 1 tablet (20 mEq total) by mouth daily. 7 tablet 0  . SYNTHROID 150 MCG tablet Take 1 tablet by mouth daily.     No current facility-administered medications for this visit.     Review of Systems:  GENERAL:  Feels good.  Active.  No fevers, sweats or weight loss. PERFORMANCE STATUS (ECOG):  *** HEENT:  No visual changes, runny nose, sore throat, mouth sores or tenderness. Lungs: No shortness of breath or cough.  No hemoptysis. Cardiac:  No chest pain, palpitations, orthopnea, or PND. GI:  No nausea, vomiting, diarrhea, constipation, melena or hematochezia. GU:  No urgency, frequency, dysuria, or hematuria. Musculoskeletal:  No back pain.  No joint pain.  No muscle tenderness. Extremities:  No pain or swelling. Skin:  No rashes or skin changes. Neuro:  No headache, numbness or weakness, balance or coordination issues. Endocrine:  No diabetes, thyroid issues, hot flashes or night sweats. Psych:  No mood changes, depression or anxiety. Pain:  No focal pain. Review of systems:  All other systems reviewed and found to be negative.  Physical Exam: There were no vitals taken for this visit. GENERAL:  Well  developed, well nourished, **man sitting comfortably in the exam room in no acute distress. MENTAL STATUS:  Alert and oriented to person, place and time. HEAD:  *** hair.  Normocephalic, atraumatic, face symmetric, no Cushingoid features. EYES:  *** eyes.  Pupils equal round and reactive to light and accomodation.  No conjunctivitis or scleral icterus. ENT:  Oropharynx clear without lesion.  Tongue normal. Mucous membranes moist.  RESPIRATORY:  Clear to auscultation without rales, wheezes or rhonchi. CARDIOVASCULAR:  Regular rate and rhythm without murmur, rub or  gallop. ABDOMEN:  Soft, non-tender, with active bowel sounds, and no hepatosplenomegaly.  No masses. SKIN:  No rashes, ulcers or lesions. EXTREMITIES: No edema, no skin discoloration or tenderness.  No palpable cords. LYMPH NODES: No palpable cervical, supraclavicular, axillary or inguinal adenopathy  NEUROLOGICAL: Unremarkable. PSYCH:  Appropriate.   Admission on 06/29/2017, Discharged on 06/29/2017  Component Date Value Ref Range Status  . WBC 06/28/2017 6.1  3.6 - 11.0 K/uL Final  . RBC 06/28/2017 3.98  3.80 - 5.20 MIL/uL Final  . Hemoglobin 06/28/2017 11.5* 12.0 - 16.0 g/dL Final  . HCT 06/28/2017 35.0  35.0 - 47.0 % Final  . MCV 06/28/2017 88.0  80.0 - 100.0 fL Final  . MCH 06/28/2017 28.9  26.0 - 34.0 pg Final  . MCHC 06/28/2017 32.8  32.0 - 36.0 g/dL Final  . RDW 06/28/2017 14.1  11.5 - 14.5 % Final  . Platelets 06/28/2017 403  150 - 440 K/uL Final  . Neutrophils Relative % 06/28/2017 65  % Final  . Neutro Abs 06/28/2017 4.0  1.4 - 6.5 K/uL Final  . Lymphocytes Relative 06/28/2017 21  % Final  . Lymphs Abs 06/28/2017 1.3  1.0 - 3.6 K/uL Final  . Monocytes Relative 06/28/2017 10  % Final  . Monocytes Absolute 06/28/2017 0.6  0.2 - 0.9 K/uL Final  . Eosinophils Relative 06/28/2017 3  % Final  . Eosinophils Absolute 06/28/2017 0.2  0 - 0.7 K/uL Final  . Basophils Relative 06/28/2017 1  % Final  . Basophils Absolute 06/28/2017 0.1  0 - 0.1 K/uL Final   Performed at [facility], [address], [place], [place] [address]  . Sodium 06/28/2017 139  135 - 145 mmol/L Final  . Potassium 06/28/2017 3.1* 3.5 - 5.1 mmol/L Final  . Chloride 06/28/2017 102  101 - 111 mmol/L Final  . CO2 06/28/2017 24  22 - 32 mmol/L Final  . Glucose, Bld 06/28/2017 136* 65 - 99 mg/dL Final  . BUN 06/28/2017 16  6 - 20 mg/dL Final  . Creatinine, Ser 06/28/2017 1.04* 0.44 - 1.00 mg/dL Final  . Calcium 06/28/2017 9.4  8.9 - 10.3 mg/dL Final  . Total Protein 06/28/2017 8.5* 6.5 - 8.1 g/dL Final   . Albumin 06/28/2017 3.5  3.5 - 5.0 g/dL Final  . AST 06/28/2017 18  15 - 41 U/L Final  . ALT 06/28/2017 8* 14 - 54 U/L Final  . Alkaline Phosphatase 06/28/2017 70  38 - 126 U/L Final  . Total Bilirubin 06/28/2017 0.4  0.3 - 1.2 mg/dL Final  . GFR calc non Af Amer 06/28/2017 46* >60 mL/min Final  . GFR calc Af Amer 06/28/2017 54* >60 mL/min Final   Comment: (NOTE) The eGFR has been calculated using the CKD EPI equation. This calculation has not been validated in all clinical situations. eGFR's persistently <60 mL/min signify possible Chronic Kidney Disease.   . Anion gap 06/28/2017 13  5 - 15 Final   Performed  at [facility], [address] [place], [place] [address]    Assessment:  Susan Villegas is a 84 y.o. female ***  Plan: 1.   2.   3.   4.    Lequita Asal, MD  06/29/2017, 2:39 PM

## 2017-06-29 NOTE — ED Notes (Signed)
Pt in reporting (L) sided abdominal discomfort that she describes as gas. Pt reports taking dulcolax and miralax to relieve constipation with no success. She reports recurrent fecal impactions. Last bowel movement 06/24/2017 per patient report

## 2017-07-09 ENCOUNTER — Emergency Department
Admission: EM | Admit: 2017-07-09 | Discharge: 2017-07-09 | Disposition: A | Discharge status: Home / Self Care | Payer: Medicare Other | Attending: Student in an Organized Health Care Education/Training Program | Admitting: Student in an Organized Health Care Education/Training Program

## 2017-07-09 ENCOUNTER — Emergency Department: Payer: Medicare Other

## 2017-07-09 ENCOUNTER — Encounter: Payer: Self-pay | Admitting: Emergency Medicine

## 2017-07-09 DIAGNOSIS — R222 Localized swelling, mass and lump, trunk: Secondary | ICD-10-CM

## 2017-07-09 DIAGNOSIS — R52 Pain, unspecified: Secondary | ICD-10-CM

## 2017-07-09 DIAGNOSIS — Z79899 Other long term (current) drug therapy: Secondary | ICD-10-CM | POA: Diagnosis not present

## 2017-07-09 DIAGNOSIS — I10 Essential (primary) hypertension: Secondary | ICD-10-CM | POA: Diagnosis not present

## 2017-07-09 DIAGNOSIS — M533 Sacrococcygeal disorders, not elsewhere classified: Secondary | ICD-10-CM

## 2017-07-09 DIAGNOSIS — G20 Parkinson's disease: Secondary | ICD-10-CM | POA: Diagnosis not present

## 2017-07-09 DIAGNOSIS — K642 Third degree hemorrhoids: Secondary | ICD-10-CM | POA: Insufficient documentation

## 2017-07-09 DIAGNOSIS — K6289 Other specified diseases of anus and rectum: Secondary | ICD-10-CM | POA: Diagnosis present

## 2017-07-09 MED ORDER — TRAMADOL HCL 50 MG PO TABS: 50.0000 mg | ORAL_TABLET | Freq: Three times a day (TID) | ORAL | 0 refills | Status: AC | PRN

## 2017-07-09 MED ORDER — TRAMADOL HCL 50 MG PO TABS
50.0000 mg | ORAL_TABLET | Freq: Once | ORAL | Status: AC
  Administered 2017-07-09: 50 mg via ORAL
  Filled 2017-07-09: qty 1

## 2017-07-09 NOTE — ED Provider Notes (Signed)
[facility] Emergency Department Provider Note  ____________________________________________  Time seen: Approximately 6:15 PM  I have reviewed the triage vital signs and the nursing notes.   HISTORY  Chief Complaint Rectal Pain    HPI Susan Villegas is a 84 y.o. female presents to the emergency department with 10 out of 10 sacral pain.  Patient is under the impression that her rectum has prolapsed.  Patient has never been diagnosed with rectal prolapse in the past.  Patient does have a history of hemorrhoids and constipation.  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  Patient has not sought care with hematology oncology.  Patient reports that her pain is worsening and she is presenting to the emergency department to "get answers".  She denies fever, chills, night sweats, weight loss or weight gain.  Patient has her adult son living with her which she states was "the biggest mistake of ever made". Today, patient is accompanied by her daughter.  She walks with the assistance of a walker.  She denies episodes of incontinence.  Past Medical History:  Diagnosis Date  . Arthritis   . Hypertension   . Parkinson's disease [facility])     Patient Active Problem List   Diagnosis Date Noted  . Sacral mass 06/29/2017    History reviewed. No pertinent surgical history.  Prior to Admission medications   Medication Sig Start Date End Date Taking? Authorizing Provider  DULoxetine (CYMBALTA) 60 MG capsule Take 1 capsule by mouth daily. 06/13/17   [provider]  felodipine (PLENDIL) 5 MG 24 hr tablet Take 1 tablet by mouth 2 (two) times daily. 06/14/17   [provider]  HYDROcodone-acetaminophen (NORCO/VICODIN) 5-325 MG tablet Take 1 tablet by mouth every 4 (four) hours as needed for moderate pain. Patient not taking: Reported on 06/29/2017 07/21/15   Lavonia Drafts, MD  lidocaine  (LIDODERM) 5 % Place 1 patch onto the skin every 12 (twelve) hours. Remove & Discard patch within 12 hours or as directed by MD 12/29/16 12/29/17  Merlyn Lot, MD  losartan (COZAAR) 25 MG tablet Take 1 tablet by mouth daily. 05/09/17   [provider]  oxybutynin (DITROPAN) 5 MG tablet Take 1 tablet by mouth 2 (two) times daily. 06/17/17   [provider]  polyethylene glycol (MIRALAX / GLYCOLAX) packet Take 17 g by mouth daily. 06/27/17   [provider]  potassium chloride (K-DUR) 10 MEQ tablet Take 4 tablets (40 mEq total) by mouth once for 1 dose. 05/14/17 05/14/17  Schaevitz, Randall An, MD  potassium chloride SA (KLOR-CON M20) 20 MEQ tablet Take 1 tablet (20 mEq total) by mouth daily. 06/29/17   Hinda Kehr, MD  SYNTHROID 150 MCG tablet Take 1 tablet by mouth daily. 06/27/17   [provider]  traMADol (ULTRAM) 50 MG tablet Take 1 tablet (50 mg total) by mouth 3 (three) times daily as needed for up to 5 days. 07/09/17 07/14/17  Lannie Fields, PA-C    Allergies Patient has no known allergies.  No family history on file.  Social History Social History   Tobacco Use  . Smoking status: Never Smoker  . Smokeless tobacco: Never Used  Substance Use Topics  . Alcohol use: No  . Drug use: No     Review of Systems  Constitutional: No fever/chills Eyes: No visual changes. No discharge ENT: No upper respiratory complaints. Cardiovascular: no chest pain. Respiratory: no cough. No SOB.  Musculoskeletal: Patient has sacral pain.  Skin: Negative for rash, abrasions, lacerations, ecchymosis. Neurological: Negative for headaches, focal weakness or numbness.   ____________________________________________   PHYSICAL EXAM:  VITAL SIGNS: ED Triage Vitals  Enc Vitals Group     BP 07/09/17 1720 136/74     Pulse Rate 07/09/17 1720 99     Resp 07/09/17 1720 18     Temp 07/09/17 1720 97.8 F (36.6 C)     Temp Source 07/09/17 1720 Oral     SpO2  07/09/17 1720 97 %     Weight 07/09/17 1721 173 lb (78.5 kg)     Height 07/09/17 1721 5\' 3"  (1.6 m)     Head Circumference --      Peak Flow --      Pain Score 07/09/17 1720 5     Pain Loc --      Pain Edu? --      Excl. in [place]? --      Constitutional: Alert and oriented. Well appearing and in no acute distress. Eyes: Conjunctivae are normal. PERRL. EOMI. Head: Atraumatic. ENT:      Ears: TMs are pearly.       Nose: No congestion/rhinnorhea.      Mouth/Throat: Mucous membranes are moist.  Neck: No stridor. No cervical spine tenderness to palpation.  Cardiovascular: Normal rate, regular rhythm. Normal S1 and S2.  Good peripheral circulation. Respiratory: Normal respiratory effort without tachypnea or retractions. Lungs CTAB. Good air entry to the bases with no decreased or absent breath sounds. Gastrointestinal: Bowel sounds 4 quadrants. Soft and nontender to palpation. No guarding or rigidity. No palpable masses. No distention. No CVA tenderness. No rectal prolapse with standing position.  Patient has stage III nonthrombosed hemorrhoids. Musculoskeletal: Full range of motion to all extremities. No gross deformities appreciated. Neurologic:  Normal speech and language. No gross focal neurologic deficits are appreciated.  Skin:  Skin is warm, dry and intact. No rash noted. Psychiatric: Mood and affect are normal. Speech and behavior are normal. Patient exhibits appropriate insight and judgement.   ____________________________________________   LABS (all labs ordered are listed, but only abnormal results are displayed)  Labs Reviewed - No data to display ____________________________________________  EKG   ____________________________________________  RADIOLOGY Unk Pinto, personally viewed and evaluated these images (plain radiographs) as part of my medical decision making, as well as reviewing the written report by the radiologist.    Dg Pelvis 1-2 Views  Result  Date: 07/09/2017 CLINICAL DATA:  Pain. EXAM: PELVIS - 1-2 VIEW COMPARISON:  06/29/2017 FINDINGS: There is advanced degenerative disc disease within the lower lumbar spine. Both hips appear located. There is no acute fracture or dislocation identified. Mild bilateral hip osteoarthritis. No acute fracture or subluxation. IMPRESSION: 1. No acute findings. 2. Mild bilateral hip osteoarthritis and lumbar degenerative disc disease. Electronically Signed   By: Kerby Moors M.D.   On: 07/09/2017 18:59    ____________________________________________    PROCEDURES  Procedure(s) performed:    Procedures    Medications  traMADol (ULTRAM) tablet 50 mg (50 mg Oral Given 07/09/17 1829)     ____________________________________________   INITIAL IMPRESSION / ASSESSMENT AND PLAN / ED COURSE  Pertinent labs & imaging results that were available during my care of the patient were reviewed by me and considered in my medical decision making (see chart for details).  Review of the [place] CSRS was performed in accordance of the [place] prior to dispensing any controlled drugs.  Assessment and Plan: Patient presents to the emergency department with sacral pain and concern for prolapsed rectum.  Differential diagnosis included bony lesion, pathologic fracture, prolapsed rectum and hemorrhoids.  X-ray examination of the pelvis was reassuring without evidence of bony metastasis at this time.  Patient's pain resolved with tramadol.  Patient was discharged with tramadol. Patient education was given regarding judicial use of tramadol in order to prevent worsening of constipation.  Patient education was provided regarding the importance of appropriate follow-up with oncology.  Directions were given to patient's daughter, who voiced understanding.  Vital signs were reassuring prior to discharge.  All patient questions were answered.    ____________________________________________  FINAL CLINICAL IMPRESSION(S) /  ED DIAGNOSES  Final diagnoses:  Sacral mass      NEW MEDICATIONS STARTED DURING THIS VISIT:  ED Discharge Orders        Ordered    traMADol (ULTRAM) 50 MG tablet  3 times daily PRN     07/09/17 1926          This chart was dictated using voice recognition software/Dragon. Despite best efforts to proofread, errors can occur which can change the meaning. Any change was purely unintentional.    Lannie Fields, PA-C 07/09/17 Alfonzo Beers, MD 07/09/17 [phone]

## 2017-07-09 NOTE — ED Triage Notes (Signed)
Pt arrived with complaints of a protruding anus. Pt states "its sticking out about 4-5 inches." No protrusion visualized in triage. Pt states she was constipated and her son gave her miralax which resolved the constipation. Pt also complains right hip pain from mechanical fall "last summer." Pt denies every following up with a doctor about her hip pain.

## 2017-07-11 ENCOUNTER — Other Ambulatory Visit: Payer: Self-pay | Admitting: Internal Medicine

## 2017-07-11 DIAGNOSIS — M533 Sacrococcygeal disorders, not elsewhere classified: Secondary | ICD-10-CM

## 2017-07-11 DIAGNOSIS — R222 Localized swelling, mass and lump, trunk: Principal | ICD-10-CM

## 2017-07-12 ENCOUNTER — Inpatient Hospital Stay: Payer: Medicare Other | Admitting: Hematology and Oncology

## 2017-07-12 NOTE — Progress Notes (Deleted)
[facility] day:  07/12/2017  Chief Complaint: Susan Villegas is a 84 y.o. female with a sacral mass who is referred by Dr. Merlyn Lot in consultation for assessment and management.  HPI: The patient resented to the [facility] ER on 06/29/2017 with a 4-day history of constipation relieved by Dulcolax.  Pain films revealed fecal loading in the colon.  Enema was unsuccessful.   Labs revealed a normal CBC.  Creatinine was 1.04.  LFTs were normal.  Protein was 8.5 with an albumen of 3.5.  Abdomen and pelvic CT on 06/29/2017 revealed a 6 x 9 cm mass in the right sacrum extending anterior and posterior to the sacrum into the soft tissues. This was consistent with neoplasm and could represent myeloma or metastatic disease.  There was a patient with moderate stool in the right and transverse colon.  Prominence of the bile ducts and bladder wall thickening.  She was referred to oncology, but did not make appointment.  She was seen in the [facility] ER again on 07/09/2017 with 10 out of 10 sacral pain.  Pelvic films revealed no acute findings.  She was treated with Tramadol and encouraged to follow-up with oncology.      Past Medical History:  Diagnosis Date  . Arthritis   . Hypertension   . Parkinson's disease ([place])     No past surgical history on file.  No family history on file.  Social History:  reports that  has never smoked. she has never used smokeless tobacco. She reports that she does not drink alcohol or use drugs.  The patient is accompanied by *** alone today.  Allergies: No Known Allergies  Current Medications: Current Outpatient Medications  Medication Sig Dispense Refill  . DULoxetine (CYMBALTA) 60 MG capsule Take 1 capsule by mouth daily.  0  . felodipine (PLENDIL) 5 MG 24 hr tablet Take 1 tablet by mouth 2 (two) times daily.  1  . HYDROcodone-acetaminophen (NORCO/VICODIN) 5-325 MG tablet Take 1 tablet by mouth every 4 (four) hours  as needed for moderate pain. (Patient not taking: Reported on 06/29/2017) 20 tablet 0  . lidocaine (LIDODERM) 5 % Place 1 patch onto the skin every 12 (twelve) hours. Remove & Discard patch within 12 hours or as directed by MD 10 patch 0  . losartan (COZAAR) 25 MG tablet Take 1 tablet by mouth daily.    Marland Kitchen oxybutynin (DITROPAN) 5 MG tablet Take 1 tablet by mouth 2 (two) times daily.  2  . polyethylene glycol (MIRALAX / GLYCOLAX) packet Take 17 g by mouth daily.    . potassium chloride (K-DUR) 10 MEQ tablet Take 4 tablets (40 mEq total) by mouth once for 1 dose. 4 tablet 0  . potassium chloride SA (KLOR-CON M20) 20 MEQ tablet Take 1 tablet (20 mEq total) by mouth daily. 7 tablet 0  . SYNTHROID 150 MCG tablet Take 1 tablet by mouth daily.    . traMADol (ULTRAM) 50 MG tablet Take 1 tablet (50 mg total) by mouth 3 (three) times daily as needed for up to 5 days. 15 tablet 0   No current facility-administered medications for this visit.     Review of Systems:  GENERAL:  Feels good.  Active.  No fevers, sweats or weight loss. PERFORMANCE STATUS (ECOG):  *** HEENT:  No visual changes, runny nose, sore throat, mouth sores or tenderness. Lungs: No shortness of breath or cough.  No hemoptysis. Cardiac:  No chest pain, palpitations,  orthopnea, or PND. GI:  No nausea, vomiting, diarrhea, constipation, melena or hematochezia. GU:  No urgency, frequency, dysuria, or hematuria. Musculoskeletal:  No back pain.  No joint pain.  No muscle tenderness. Extremities:  No pain or swelling. Skin:  No rashes or skin changes. Neuro:  No headache, numbness or weakness, balance or coordination issues. Endocrine:  No diabetes, thyroid issues, hot flashes or night sweats. Psych:  No mood changes, depression or anxiety. Pain:  No focal pain. Review of systems:  All other systems reviewed and found to be negative.  Physical Exam: There were no vitals taken for this visit. GENERAL:  Well developed, well nourished, **man  sitting comfortably in the exam room in no acute distress. MENTAL STATUS:  Alert and oriented to person, place and time. HEAD:  *** hair.  Normocephalic, atraumatic, face symmetric, no Cushingoid features. EYES:  *** eyes.  Pupils equal round and reactive to light and accomodation.  No conjunctivitis or scleral icterus. ENT:  Oropharynx clear without lesion.  Tongue normal. Mucous membranes moist.  RESPIRATORY:  Clear to auscultation without rales, wheezes or rhonchi. CARDIOVASCULAR:  Regular rate and rhythm without murmur, rub or gallop. ABDOMEN:  Soft, non-tender, with active bowel sounds, and no hepatosplenomegaly.  No masses. SKIN:  No rashes, ulcers or lesions. EXTREMITIES: No edema, no skin discoloration or tenderness.  No palpable cords. LYMPH NODES: No palpable cervical, supraclavicular, axillary or inguinal adenopathy  NEUROLOGICAL: Unremarkable. PSYCH:  Appropriate.   No visits with results within 3 Day(s) from this visit.  Latest known visit with results is:  Admission on 06/29/2017, Discharged on 06/29/2017  Component Date Value Ref Range Status  . WBC 06/28/2017 6.1  3.6 - 11.0 K/uL Final  . RBC 06/28/2017 3.98  3.80 - 5.20 MIL/uL Final  . Hemoglobin 06/28/2017 11.5* 12.0 - 16.0 g/dL Final  . HCT 06/28/2017 35.0  35.0 - 47.0 % Final  . MCV 06/28/2017 88.0  80.0 - 100.0 fL Final  . MCH 06/28/2017 28.9  26.0 - 34.0 pg Final  . MCHC 06/28/2017 32.8  32.0 - 36.0 g/dL Final  . RDW 06/28/2017 14.1  11.5 - 14.5 % Final  . Platelets 06/28/2017 403  150 - 440 K/uL Final  . Neutrophils Relative % 06/28/2017 65  % Final  . Neutro Abs 06/28/2017 4.0  1.4 - 6.5 K/uL Final  . Lymphocytes Relative 06/28/2017 21  % Final  . Lymphs Abs 06/28/2017 1.3  1.0 - 3.6 K/uL Final  . Monocytes Relative 06/28/2017 10  % Final  . Monocytes Absolute 06/28/2017 0.6  0.2 - 0.9 K/uL Final  . Eosinophils Relative 06/28/2017 3  % Final  . Eosinophils Absolute 06/28/2017 0.2  0 - 0.7 K/uL Final  .  Basophils Relative 06/28/2017 1  % Final  . Basophils Absolute 06/28/2017 0.1  0 - 0.1 K/uL Final   Performed at [facility], [address], [place], [place] [address]  . Sodium 06/28/2017 139  135 - 145 mmol/L Final  . Potassium 06/28/2017 3.1* 3.5 - 5.1 mmol/L Final  . Chloride 06/28/2017 102  101 - 111 mmol/L Final  . CO2 06/28/2017 24  22 - 32 mmol/L Final  . Glucose, Bld 06/28/2017 136* 65 - 99 mg/dL Final  . BUN 06/28/2017 16  6 - 20 mg/dL Final  . Creatinine, Ser 06/28/2017 1.04* 0.44 - 1.00 mg/dL Final  . Calcium 06/28/2017 9.4  8.9 - 10.3 mg/dL Final  . Total Protein 06/28/2017 8.5* 6.5 - 8.1 g/dL Final  .  Albumin 06/28/2017 3.5  3.5 - 5.0 g/dL Final  . AST 06/28/2017 18  15 - 41 U/L Final  . ALT 06/28/2017 8* 14 - 54 U/L Final  . Alkaline Phosphatase 06/28/2017 70  38 - 126 U/L Final  . Total Bilirubin 06/28/2017 0.4  0.3 - 1.2 mg/dL Final  . GFR calc non Af Amer 06/28/2017 46* >60 mL/min Final  . GFR calc Af Amer 06/28/2017 54* >60 mL/min Final   Comment: (NOTE) The eGFR has been calculated using the CKD EPI equation. This calculation has not been validated in all clinical situations. eGFR's persistently <60 mL/min signify possible Chronic Kidney Disease.   Georgiann Hahn gap 06/28/2017 13  5 - 15 Final   Performed at [facility], [address], [place], [place] [address]    Assessment:  Susan Villegas is a 84 y.o. female ***  Plan: 1.  Review images from recent CT scan.  Discuss differential diagnosis. 2.  Labs today:  CBC with diff, BMP, SPEP, FLCA, beta2-microglobulin, LDH, CEA. 3.  Collect 22 hour urine for UPEP and free light chains. 4.  Schedule sacral biopsy.   Lequita Asal, MD  07/12/2017, 4:47 AM   I saw and evaluated the patient, participating in the key portions of the service and reviewing pertinent diagnostic studies and records.  I reviewed the nurse practitioner's note and agree with the findings and the plan.  The  assessment and plan were discussed with the patient.  Additional diagnostic studies of *** are needed to clarify *** and would change the clinical management.  A few ***multiple questions were asked by the patient and answered.   Nolon Stalls, MD 07/12/2017,4:58 AM

## 2017-07-15 NOTE — Progress Notes (Signed)
[facility] day:  07/17/2017  Chief Complaint: Susan Villegas is a 84 y.o. female with a sacral mass who is referred by Dr. Merlyn Lot in consultation for assessment and management.  HPI: The patient resented to the [facility] on 06/29/2017 with a 4-day history of constipation unrelieved by Dulcolax.  Pain films revealed fecal loading in the colon.  Enema was unsuccessful.   Labs revealed a normal CBC.  Creatinine was 1.04.  LFTs were normal.  Protein was 8.5 with an albumin of 3.5.  Abdomen and pelvic CT on 06/29/2017 revealed a 6 x 9 cm mass in the right sacrum extending anterior and posterior to the sacrum into the soft tissues. This was consistent with neoplasm and could represent myeloma or metastatic disease.  There was a patient with moderate stool in the right and transverse colon.  There was prominence of the bile ducts and bladder wall thickening.  She was referred to oncology, but did not make appointment.  She was seen in the [facility] ER again on 07/09/2017 with 10 out of 10 sacral pain.  Pelvic films revealed no acute findings.  She was treated with Tramadol and encouraged to follow-up with oncology.  Patient notes that she is a late sleeper. She sleeps in to around 10:00 am. Patient lives with her son. Son assists with all of the meal preparation. Patient maintains a predominantly sedentary lifestyle. She naps a lot during the day. She requires assistance with toileting, bathing, and dressing. Baseline weight is about 176 pounds. Patient has lost approximately 10 pounds since December 2018.   Daughter notes that patient his progressive dementia. She is being followed by neurology (Dr Melrose Nakayama). She is disoriented per baseline line. She is unable to correctly identify the month, the year, or her telephone number. Patient correctly identifies her daughter. She knows the current President of the [place] is "Trump". When asked about the  immediate past President, patient states, "It was the black guy. He was good". Patient inconsistently able to count backwards from 10 to 1 by single digits. She is unable to count backwards from 10 to 1 by increments of 3. Patient remains the ability to do simple math. She correctly identifies that there are 5 nickels in a quarter, and 4 quarters in a dollar. Patient asked if she purchased something that cost $0.85, and she gave the clerk a dollar, how much change would she receive. She correctly responded $0.15. Short term recall partially intact. Patient able to recall 2 of 3 simple words.   Symptomatically, patient has ongoing issues with constipation. Daughter notes that she has been taking Dulcolax for "over a year".  Patient has never had a screening colonoscopy. Patient has recurrent urinary tract infections and issues with incontinence. She has chronic pain in her hips. She ambulates with a walker. Patient states, "My hip gives out and I can't stand up long". Patient has had home health nursing and therapy services in the recent past, however her daughter notes that services ended last week.   Daughter has contacted Hospice. She is asking for a referral to be sent. Patient indicates that she wishes for no heroic measures be taken to prolong her life. No HCPOA has been legally defined. There are a great deal of family dynamics between patient's children.   The patient had  2 sisters. One sister passed away from ovarian cancer, and the other from an unknown cancer.    Past Medical History:  Diagnosis Date  . Arthritis   . Hypertension   . Parkinson's disease ([place])     History reviewed. No pertinent surgical history.  Family History  Problem Relation Age of Onset  . Cancer Sister     Social History:  reports that she quit smoking about 20 years ago. She has a 7.50 pack-year smoking history. she has never used smokeless tobacco. She reports that she does not drink alcohol or use drugs.  Patient dips snuff. She is a former cigarette smoker. She quit 30 years ago.  Patient lives with her son Susan Villegas) in [place], [place]. Daughter Susan Villegas) lives in [place], [place]. The patient is accompanied by her daughter, Susan Villegas,  Villegas.  Allergies: No Known Allergies  Current Medications: Current Outpatient Medications  Medication Sig Dispense Refill  . donepezil (ARICEPT) 10 MG tablet Take 10 mg by mouth 2 (two) times daily.    . DULoxetine (CYMBALTA) 60 MG capsule Take 1 capsule by mouth daily.  0  . felodipine (PLENDIL) 5 MG 24 hr tablet Take 1 tablet by mouth 2 (two) times daily.  1  . hydrochlorothiazide (HYDRODIURIL) 25 MG tablet Take 25 mg by mouth daily.    Marland Kitchen HYDROcodone-acetaminophen (NORCO/VICODIN) 5-325 MG tablet Take 1 tablet by mouth every 4 (four) hours as needed for moderate pain. 20 tablet 0  . lidocaine (LIDODERM) 5 % Place 1 patch onto the skin every 12 (twelve) hours. Remove & Discard patch within 12 hours or as directed by MD 10 patch 0  . losartan (COZAAR) 25 MG tablet Take 1 tablet by mouth daily.    Marland Kitchen oxybutynin (DITROPAN) 5 MG tablet Take 1 tablet by mouth 2 (two) times daily.  2  . polyethylene glycol (MIRALAX / GLYCOLAX) packet Take 17 g by mouth daily.    . potassium chloride SA (KLOR-CON M20) 20 MEQ tablet Take 1 tablet (20 mEq total) by mouth daily. 7 tablet 0  . SYNTHROID 150 MCG tablet Take 1 tablet by mouth daily.    . potassium chloride (K-DUR) 10 MEQ tablet Take 4 tablets (40 mEq total) by mouth once for 1 dose. 4 tablet 0   No current facility-administered medications for this visit.     Review of Systems:  GENERAL:  Feels good.  Sedentary.  No fevers, sweats.  Weight loss of 10 pounds since 04/2017. PERFORMANCE STATUS (ECOG):  3. HEENT:  No visual changes, runny nose, sore throat, mouth sores or tenderness. Lungs: No shortness of breath or cough.  No hemoptysis. Cardiac:  No chest pain, palpitations, orthopnea, or PND. GI:  Chronic constipation.  No  nausea, vomiting, diarrhea, melena or hematochezia.  No prior colonoscopy. GU:  No urgency, frequency, dysuria, or hematuria.  Incontinence.  Recurrent UTIs. Musculoskeletal:  Chronic lower back pain.  Right hip pain.  Left knee issues.  No muscle tenderness. Extremities:  No pain or swelling. Skin:  No rashes or skin changes. Neuro:  Dementia.  No headache, numbness or weakness, balance or coordination issues. Endocrine:  No diabetes.  Thyroid disease on Synthroid.  No hot flashes or night sweats. Psych:  No mood changes, depression or anxiety. Pain:  Back pain (8 out of 10). Review of systems:  All other systems reviewed and found to be negative.  Physical Exam: Blood pressure 138/78, pulse 97, temperature 98.3 F (36.8 C), temperature source Tympanic, height 5' 3"  (1.6 m), weight 166 lb 9 oz (75.6 kg). GENERAL:  Elderly woman sitting comfortably in a wheelchair in the exam room in no  acute distress. MENTAL STATUS:  Alert and oriented to person. HEAD:  Black curly hair; wearing ballcap.  Normocephalic, atraumatic, face symmetric, no Cushingoid features. EYES:  Glasses. Brown eyes.  Pupils equal round and reactive to light and accomodation.  No conjunctivitis or scleral icterus. ENT:  Oropharynx clear without lesion.  Tongue normal. Mucous membranes moist.  RESPIRATORY:  Clear to auscultation without rales, wheezes or rhonchi. CARDIOVASCULAR:  Regular rate and rhythm without murmur, rub or gallop. ABDOMEN:  Soft, non-tender, with active bowel sounds, and no hepatosplenomegaly.  No masses. BACK:  No pain to palpation lower back, sacrum, and iliac crest. SKIN:  No rashes, ulcers or lesions. EXTREMITIES: No edema, no skin discoloration or tenderness.  No palpable cords. LYMPH NODES: No palpable cervical, supraclavicular, axillary or inguinal adenopathy  NEUROLOGICAL: Memory poor (see HPI).  Moves all 4 extremities. PSYCH:  Appropriate.   No visits with results within 3 Day(s) from this  visit.  Latest known visit with results is:  Admission on 06/29/2017, Discharged on 06/29/2017  Component Date Value Ref Range Status  . WBC 06/28/2017 6.1  3.6 - 11.0 K/uL Final  . RBC 06/28/2017 3.98  3.80 - 5.20 MIL/uL Final  . Hemoglobin 06/28/2017 11.5* 12.0 - 16.0 g/dL Final  . HCT 06/28/2017 35.0  35.0 - 47.0 % Final  . MCV 06/28/2017 88.0  80.0 - 100.0 fL Final  . MCH 06/28/2017 28.9  26.0 - 34.0 pg Final  . MCHC 06/28/2017 32.8  32.0 - 36.0 g/dL Final  . RDW 06/28/2017 14.1  11.5 - 14.5 % Final  . Platelets 06/28/2017 403  150 - 440 K/uL Final  . Neutrophils Relative % 06/28/2017 65  % Final  . Neutro Abs 06/28/2017 4.0  1.4 - 6.5 K/uL Final  . Lymphocytes Relative 06/28/2017 21  % Final  . Lymphs Abs 06/28/2017 1.3  1.0 - 3.6 K/uL Final  . Monocytes Relative 06/28/2017 10  % Final  . Monocytes Absolute 06/28/2017 0.6  0.2 - 0.9 K/uL Final  . Eosinophils Relative 06/28/2017 3  % Final  . Eosinophils Absolute 06/28/2017 0.2  0 - 0.7 K/uL Final  . Basophils Relative 06/28/2017 1  % Final  . Basophils Absolute 06/28/2017 0.1  0 - 0.1 K/uL Final   Performed at [facility], [address] [place],  [address]  . Sodium 06/28/2017 139  135 - 145 mmol/L Final  . Potassium 06/28/2017 3.1* 3.5 - 5.1 mmol/L Final  . Chloride 06/28/2017 102  101 - 111 mmol/L Final  . CO2 06/28/2017 24  22 - 32 mmol/L Final  . Glucose, Bld 06/28/2017 136* 65 - 99 mg/dL Final  . BUN 06/28/2017 16  6 - 20 mg/dL Final  . Creatinine, Ser 06/28/2017 1.04* 0.44 - 1.00 mg/dL Final  . Calcium 06/28/2017 9.4  8.9 - 10.3 mg/dL Final  . Total Protein 06/28/2017 8.5* 6.5 - 8.1 g/dL Final  . Albumin 06/28/2017 3.5  3.5 - 5.0 g/dL Final  . AST 06/28/2017 18  15 - 41 U/L Final  . ALT 06/28/2017 8* 14 - 54 U/L Final  . Alkaline Phosphatase 06/28/2017 70  38 - 126 U/L Final  . Total Bilirubin 06/28/2017 0.4  0.3 - 1.2 mg/dL Final  . GFR calc non Af Amer 06/28/2017 46* >60 mL/min Final  . GFR calc  Af Amer 06/28/2017 54* >60 mL/min Final   Comment: (NOTE) The eGFR has been calculated using the CKD EPI equation. This calculation has not been validated in  all clinical situations. eGFR's persistently <60 mL/min signify possible Chronic Kidney Disease.   Georgiann Hahn gap 06/28/2017 13  5 - 15 Final   Performed at [facility], [address] [place], [place] [address]    Assessment:  Bellamie Turney is a 84 y.o. female with a sacral mass presenting with pain and constipation.  Abdomen and pelvic CT on 06/29/2017 revealed a 6 x 9 cm mass in the right sacrum extending anterior and posterior to the sacrum into the soft tissues. This was consistent with neoplasm and could represent myeloma or metastatic disease.   She has dementia and is followed by neurology.  She has no medical power of attorney.  Symptomatically, she has back pain and constipation.  She has lost 10 pounds since 04/2017.  Performance status is 3.  Plan: 1.  Review images from recent CT scan.  Discuss differential diagnosis.  Discuss concern for multiple myeloma or metastatic disease. 2.  Labs Villegas:  CBC with diff, BMP, PT, SPEP, FLCA, beta2-microglobulin, LDH, CEA. 3.  Collect 24 hour urine for UPEP and free light chains. 4.  Sacral biopsy scheduled for 07/20/2017. 5.  [facility] referral. Family needs additional assistance caring for patient in the home. Will send referral to [facility].  6.  Discuss Hospice referral.  Patient and daughter informed that if she desires treatment, she would be seen by [facility] rather than Hospice.  7.  Discuss importance of medical power of attorney designation and living will decisions.  Patient and her children (son and daughter) to discuss. 8.  RTC on 07/27/2017 for MD assessment, review of workup, and further discussion regarding direction of treatment.    Honor Loh, NP  07/17/2017, 2:36 PM   I saw and evaluated the patient, participating in the key  portions of the service and reviewing pertinent diagnostic studies and records.  I reviewed the nurse practitioner's note and agree with the findings and the plan.  The assessment and plan were discussed with the patient.  Several questions were asked by the patient and answered.   Nolon Stalls, MD 07/17/2017,2:36 PM

## 2017-07-17 ENCOUNTER — Other Ambulatory Visit: Payer: Self-pay | Admitting: Urgent Care

## 2017-07-17 ENCOUNTER — Inpatient Hospital Stay: Payer: Medicare Other | Admitting: *Deleted

## 2017-07-17 ENCOUNTER — Encounter: Payer: Self-pay | Admitting: Hematology and Oncology

## 2017-07-17 ENCOUNTER — Inpatient Hospital Stay: Payer: Medicare Other | Attending: Hematology and Oncology | Admitting: Hematology and Oncology

## 2017-07-17 VITALS — BP 138/78 | HR 97 | Temp 98.3°F | Ht 63.0 in | Wt 166.6 lb

## 2017-07-17 DIAGNOSIS — R222 Localized swelling, mass and lump, trunk: Secondary | ICD-10-CM | POA: Insufficient documentation

## 2017-07-17 DIAGNOSIS — M533 Sacrococcygeal disorders, not elsewhere classified: Secondary | ICD-10-CM

## 2017-07-17 DIAGNOSIS — F0390 Unspecified dementia without behavioral disturbance: Secondary | ICD-10-CM | POA: Insufficient documentation

## 2017-07-17 DIAGNOSIS — M549 Dorsalgia, unspecified: Secondary | ICD-10-CM | POA: Diagnosis not present

## 2017-07-17 DIAGNOSIS — M25551 Pain in right hip: Secondary | ICD-10-CM | POA: Diagnosis not present

## 2017-07-17 DIAGNOSIS — G8929 Other chronic pain: Secondary | ICD-10-CM | POA: Diagnosis not present

## 2017-07-17 DIAGNOSIS — M25552 Pain in left hip: Secondary | ICD-10-CM

## 2017-07-17 DIAGNOSIS — Z87891 Personal history of nicotine dependence: Secondary | ICD-10-CM | POA: Diagnosis not present

## 2017-07-17 DIAGNOSIS — Z79899 Other long term (current) drug therapy: Secondary | ICD-10-CM | POA: Diagnosis not present

## 2017-07-17 DIAGNOSIS — K5900 Constipation, unspecified: Secondary | ICD-10-CM | POA: Diagnosis not present

## 2017-07-17 DIAGNOSIS — Z8041 Family history of malignant neoplasm of ovary: Secondary | ICD-10-CM | POA: Insufficient documentation

## 2017-07-17 DIAGNOSIS — Z7189 Other specified counseling: Secondary | ICD-10-CM

## 2017-07-17 DIAGNOSIS — Z720 Tobacco use: Secondary | ICD-10-CM | POA: Diagnosis not present

## 2017-07-17 LAB — CBC WITH DIFFERENTIAL/PLATELET
BASOS ABS: 0.1 10*3/uL (ref 0–0.1)
BASOS PCT: 1 %
Eosinophils Absolute: 0.2 10*3/uL (ref 0–0.7)
Eosinophils Relative: 3 %
HEMATOCRIT: 33.3 % — AB (ref 35.0–47.0)
HEMOGLOBIN: 10.9 g/dL — AB (ref 12.0–16.0)
Lymphocytes Relative: 20 %
Lymphs Abs: 1.2 10*3/uL (ref 1.0–3.6)
MCH: 28.5 pg (ref 26.0–34.0)
MCHC: 32.8 g/dL (ref 32.0–36.0)
MCV: 87.1 fL (ref 80.0–100.0)
Monocytes Absolute: 0.8 10*3/uL (ref 0.2–0.9)
Monocytes Relative: 13 %
NEUTROS ABS: 3.8 10*3/uL (ref 1.4–6.5)
NEUTROS PCT: 63 %
Platelets: 391 10*3/uL (ref 150–440)
RBC: 3.82 MIL/uL (ref 3.80–5.20)
RDW: 14.9 % — AB (ref 11.5–14.5)
WBC: 6 10*3/uL (ref 3.6–11.0)

## 2017-07-17 LAB — BASIC METABOLIC PANEL
ANION GAP: 9 (ref 5–15)
BUN: 18 mg/dL (ref 6–20)
CALCIUM: 9.1 mg/dL (ref 8.9–10.3)
CO2: 25 mmol/L (ref 22–32)
Chloride: 101 mmol/L (ref 101–111)
Creatinine, Ser: 1.08 mg/dL — ABNORMAL HIGH (ref 0.44–1.00)
GFR calc non Af Amer: 44 mL/min — ABNORMAL LOW (ref >60)
GFR, EST AFRICAN AMERICAN: 51 mL/min — AB (ref >60)
Glucose, Bld: 108 mg/dL — ABNORMAL HIGH (ref 65–99)
Potassium: 3 mmol/L — ABNORMAL LOW (ref 3.5–5.1)
Sodium: 135 mmol/L (ref 135–145)

## 2017-07-17 LAB — LACTATE DEHYDROGENASE: LDH: 246 U/L — ABNORMAL HIGH (ref 98–192)

## 2017-07-17 LAB — PROTIME-INR
INR: 1.15
PROTHROMBIN TIME: 14.6 s (ref 11.4–15.2)

## 2017-07-17 NOTE — Progress Notes (Signed)
Patient here today as new evaluation regarding rectal mass. Accompanied by her daughter, Berenice Bouton.

## 2017-07-18 ENCOUNTER — Other Ambulatory Visit: Payer: Self-pay | Admitting: Radiology

## 2017-07-18 LAB — KAPPA/LAMBDA LIGHT CHAINS
KAPPA FREE LGHT CHN: 96.3 mg/L — AB (ref 3.3–19.4)
KAPPA, LAMDA LIGHT CHAIN RATIO: 2.17 — AB (ref 0.26–1.65)
LAMDA FREE LIGHT CHAINS: 44.3 mg/L — AB (ref 5.7–26.3)

## 2017-07-18 LAB — BETA 2 MICROGLOBULIN, SERUM: BETA 2 MICROGLOBULIN: 4 mg/L — AB (ref 0.6–2.4)

## 2017-07-18 LAB — CEA: CEA1: 2.6 ng/mL (ref 0.0–4.7)

## 2017-07-19 ENCOUNTER — Ambulatory Visit
Admission: RE | Admit: 2017-07-19 | Discharge: 2017-07-19 | Disposition: A | Discharge status: Home / Self Care | Payer: Medicare Other | Source: Ambulatory Visit | Attending: Internal Medicine | Admitting: Internal Medicine

## 2017-07-19 DIAGNOSIS — Z7189 Other specified counseling: Secondary | ICD-10-CM | POA: Insufficient documentation

## 2017-07-19 DIAGNOSIS — Z79890 Hormone replacement therapy (postmenopausal): Secondary | ICD-10-CM | POA: Insufficient documentation

## 2017-07-19 DIAGNOSIS — C8330 Diffuse large B-cell lymphoma, unspecified site: Secondary | ICD-10-CM | POA: Diagnosis not present

## 2017-07-19 DIAGNOSIS — Z79899 Other long term (current) drug therapy: Secondary | ICD-10-CM | POA: Insufficient documentation

## 2017-07-19 DIAGNOSIS — I10 Essential (primary) hypertension: Secondary | ICD-10-CM | POA: Diagnosis not present

## 2017-07-19 DIAGNOSIS — Z87891 Personal history of nicotine dependence: Secondary | ICD-10-CM | POA: Diagnosis not present

## 2017-07-19 DIAGNOSIS — R222 Localized swelling, mass and lump, trunk: Secondary | ICD-10-CM

## 2017-07-19 DIAGNOSIS — M1990 Unspecified osteoarthritis, unspecified site: Secondary | ICD-10-CM | POA: Diagnosis not present

## 2017-07-19 DIAGNOSIS — G20 Parkinson's disease: Secondary | ICD-10-CM | POA: Diagnosis not present

## 2017-07-19 DIAGNOSIS — M533 Sacrococcygeal disorders, not elsewhere classified: Secondary | ICD-10-CM | POA: Insufficient documentation

## 2017-07-19 DIAGNOSIS — Z809 Family history of malignant neoplasm, unspecified: Secondary | ICD-10-CM | POA: Diagnosis not present

## 2017-07-19 LAB — PROTIME-INR
INR: 1.21
Prothrombin Time: 15.2 seconds (ref 11.4–15.2)

## 2017-07-19 LAB — CBC
HEMATOCRIT: 35 % (ref 35.0–47.0)
Hemoglobin: 11.4 g/dL — ABNORMAL LOW (ref 12.0–16.0)
MCH: 28.3 pg (ref 26.0–34.0)
MCHC: 32.5 g/dL (ref 32.0–36.0)
MCV: 87.2 fL (ref 80.0–100.0)
Platelets: 385 10*3/uL (ref 150–440)
RBC: 4.01 MIL/uL (ref 3.80–5.20)
RDW: 14.9 % — AB (ref 11.5–14.5)
WBC: 4.4 10*3/uL (ref 3.6–11.0)

## 2017-07-19 LAB — APTT: aPTT: 29 seconds (ref 24–36)

## 2017-07-19 MED ORDER — FENTANYL CITRATE (PF) 100 MCG/2ML IJ SOLN
INTRAMUSCULAR | Status: AC | PRN
  Administered 2017-07-19 (×2): 25 ug via INTRAVENOUS

## 2017-07-19 MED ORDER — FENTANYL CITRATE (PF) 100 MCG/2ML IJ SOLN
INTRAMUSCULAR | Status: AC
  Filled 2017-07-19: qty 4

## 2017-07-19 MED ORDER — SODIUM CHLORIDE 0.9 % IV SOLN
INTRAVENOUS | Status: DC
  Administered 2017-07-19: 14:00:00 via INTRAVENOUS

## 2017-07-19 MED ORDER — MIDAZOLAM HCL 5 MG/5ML IJ SOLN
INTRAMUSCULAR | Status: AC | PRN
  Administered 2017-07-19: 1 mg via INTRAVENOUS

## 2017-07-19 MED ORDER — LIDOCAINE HCL (PF) 1 % IJ SOLN
INTRAMUSCULAR | Status: AC | PRN
  Administered 2017-07-19: 4 mL

## 2017-07-19 MED ORDER — MIDAZOLAM HCL 5 MG/5ML IJ SOLN
INTRAMUSCULAR | Status: AC
  Filled 2017-07-19: qty 5

## 2017-07-19 NOTE — H&P (Signed)
Chief Complaint: Here for sacral biopsy  Referring Physician(s): Johnston,John D   History of Present Illness: Susan Villegas is a 84 y.o. female with a recently found posterior Right sacral mass by CT.  Mild associated rt hip and gluteal pain. No other complaints.  Imaging is concerning for met dz vs myeloma  Past Medical History:  Diagnosis Date  . Arthritis   . Hypertension   . Parkinson's disease ([place])     History reviewed. No pertinent surgical history.  Allergies: Patient has no known allergies.  Medications: Prior to Admission medications   Medication Sig Start Date End Date Taking? Authorizing Provider  donepezil (ARICEPT) 10 MG tablet Take 10 mg by mouth 2 (two) times daily. 07/05/17 07/05/18 Yes [provider]  DULoxetine (CYMBALTA) 60 MG capsule Take 1 capsule by mouth daily. 06/13/17  Yes [provider]  felodipine (PLENDIL) 5 MG 24 hr tablet Take 1 tablet by mouth 2 (two) times daily. 06/14/17  Yes [provider]  hydrochlorothiazide (HYDRODIURIL) 25 MG tablet Take 25 mg by mouth daily.   Yes [provider]  lidocaine (LIDODERM) 5 % Place 1 patch onto the skin every 12 (twelve) hours. Remove & Discard patch within 12 hours or as directed by MD 12/29/16 12/29/17 Yes Merlyn Lot, MD  losartan (COZAAR) 25 MG tablet Take 1 tablet by mouth daily. 05/09/17  Yes [provider]  oxybutynin (DITROPAN) 5 MG tablet Take 1 tablet by mouth 2 (two) times daily. 06/17/17  Yes [provider]  polyethylene glycol (MIRALAX / GLYCOLAX) packet Take 17 g by mouth daily. 06/27/17  Yes [provider]  potassium chloride SA (KLOR-CON M20) 20 MEQ tablet Take 1 tablet (20 mEq total) by mouth daily. 06/29/17  Yes Hinda Kehr, MD  SYNTHROID 150 MCG tablet Take 1 tablet by mouth daily. 06/27/17  Yes [provider]  HYDROcodone-acetaminophen (NORCO/VICODIN) 5-325 MG tablet Take 1 tablet by mouth every 4 (four)  hours as needed for moderate pain. 07/21/15   Lavonia Drafts, MD  potassium chloride (K-DUR) 10 MEQ tablet Take 4 tablets (40 mEq total) by mouth once for 1 dose. 05/14/17 05/14/17  Schaevitz, Randall An, MD     Family History  Problem Relation Age of Onset  . Cancer Sister     Social History   Socioeconomic History  . Marital status: Widowed    Spouse name: None  . Number of children: None  . Years of education: None  . Highest education level: None  Social Needs  . Financial resource strain: None  . Food insecurity - worry: None  . Food insecurity - inability: None  . Transportation needs - medical: None  . Transportation needs - non-medical: None  Occupational History  . None  Tobacco Use  . Smoking status: Former Smoker    Packs/day: 0.50    Years: 15.00    Pack years: 7.50    Last attempt to quit: 1999    Years since quitting: 20.1  . Smokeless tobacco: Never Used  Substance and Sexual Activity  . Alcohol use: No  . Drug use: No  . Sexual activity: None  Other Topics Concern  . None  Social History Narrative  . None     Review of Systems: A 12 point ROS discussed and pertinent positives are indicated in the HPI above.  All other systems are negative.  Review of Systems  Vital Signs: BP (!) 177/82   Pulse 77  Temp 97.7 F (36.5 C) (Oral)   Resp 15   Ht _0  (1.6 m)   Wt 172 lb (78 kg)   SpO2 96%   BMI 30.47 kg/m   Physical Exam  Constitutional: She appears well-developed and well-nourished. No distress.  Eyes: Conjunctivae are normal. No scleral icterus.  Cardiovascular: Normal rate and regular rhythm.  Pulmonary/Chest: Effort normal and breath sounds normal.  Abdominal: Soft. Bowel sounds are normal.  Skin: Skin is warm and dry. She is not diaphoretic.  Psychiatric: She has a normal mood and affect.    Imaging: Dg Pelvis 1-2 Views  Result Date: 07/09/2017 CLINICAL DATA:  Pain. EXAM: PELVIS - 1-2 VIEW COMPARISON:  06/29/2017 FINDINGS:  There is advanced degenerative disc disease within the lower lumbar spine. Both hips appear located. There is no acute fracture or dislocation identified. Mild bilateral hip osteoarthritis. No acute fracture or subluxation. IMPRESSION: 1. No acute findings. 2. Mild bilateral hip osteoarthritis and lumbar degenerative disc disease. Electronically Signed   By: Kerby Moors M.D.   On: 07/09/2017 18:59   Dg Abdomen 1 View  Result Date: 06/28/2017 CLINICAL DATA:  Patient complains of rectal pain and fecal impaction. EXAM: ABDOMEN - 1 VIEW COMPARISON:  None. FINDINGS: Moderate fecal loading in the colon, particularly in the ascending colon and proximal transverse colon. No large stool ball seen in the rectum. No bowel obstruction. IMPRESSION: Moderate fecal loading in the colon as above. Electronically Signed   By: Dorise Bullion III M.D   On: 06/28/2017 23:24   Ct Abdomen Pelvis W Contrast  Result Date: 06/29/2017 CLINICAL DATA:  Distended abdomen, constipation EXAM: CT ABDOMEN AND PELVIS WITH CONTRAST TECHNIQUE: Multidetector CT imaging of the abdomen and pelvis was performed using the standard protocol following bolus administration of intravenous contrast. CONTRAST:  111m ISOVUE-300 IOPAMIDOL (ISOVUE-300) INJECTION 61% COMPARISON:  KUB 06/28/2017 FINDINGS: Lower chest: Lung bases clear Hepatobiliary: 1 cm cyst in the anterior right lobe liver unchanged from CT of 11/29/2005. Partially distended gallbladder. Common bile duct upper normal at 7 mm. Correlate with liver function test. Mild prominence of the intrahepatic bile ducts. Pancreas: Negative Spleen: Negative Adrenals/Urinary Tract: Normal kidneys without obstruction or mass or stone. Bladder wall thickening Stomach/Bowel: Moderate stool in the right and transverse colon. Negative for bowel obstruction. No bowel mass or edema. Normal appendix. Sigmoid diverticulosis. Negative for diverticulitis. Hiatal hernia Vascular/Lymphatic: Moderate atherosclerotic  disease aorta and iliac arteries without aneurysm or occlusion Reproductive: Normal uterus. Other: None Musculoskeletal: Destructive mass in the right sacrum. Soft tissue mass extends anterior and posterior to the sacral mass. Overall the mass measures approximately 6 x 9 cm. Lumbar spine degenerative changes and spinal stenosis. Anterolisthesis L4-5. No other spinal lesions. IMPRESSION: Mild prominence of the bile ducts. Correlate with liver function tests. Bladder wall thickening Constipation with moderate stool in the right and transverse colon. Negative for bowel obstruction 6 x 9 cm mass in the right sacrum extending anterior and posterior to the sacrum into the soft tissues. This is consistent with neoplasm and could represent myeloma or metastatic disease. Electronically Signed   By: CFranchot GalloM.D.   On: 06/29/2017 07:20    Labs:  CBC: Recent Labs    05/14/17 0634 06/28/17 2249 07/17/17 1550 07/19/17 1322  WBC 5.8 6.1 6.0 4.4  HGB 12.1 11.5* 10.9* 11.4*  HCT 37.0 35.0 33.3* 35.0  PLT 331 403 391 385    COAGS: Recent Labs    07/17/17 1550 07/19/17 1322  INR 1.15  1.21  APTT  --  29    BMP: Recent Labs    05/14/17 0634 06/28/17 2249 07/17/17 1550  NA 139 139 135  K 2.8* 3.1* 3.0*  CL 106 102 101  CO2 _0 GLUCOSE 108* 136* 108*  BUN _1 CALCIUM 9.0 9.4 9.1  CREATININE 1.22* 1.04* 1.08*  GFRNONAA 38* 46* 44*  GFRAA 44* 54* 51*    LIVER FUNCTION TESTS: Recent Labs    06/28/17 2249  BILITOT 0.4  AST 18  ALT 8*  ALKPHOS 70  PROT 8.5*  ALBUMIN 3.5    TUMOR MARKERS: No results for input(s): AFPTM, CEA, CA199, CHROMGRNA in the last 8760 hours.  Assessment and Plan:  New rt sacral mass with associated pain.  Concern for met dz vs myeloma by imaging.  Consent obtained for CT core bx  Risks and benefits discussed with the patient including, but not limited to bleeding, infection, damage to adjacent structures or low yield requiring additional  tests.  All of the patient's questions were answered, patient is agreeable to proceed. Consent signed and in chart.    Thank you for this interesting consult.  I greatly enjoyed meeting [facility] and look forward to participating in their care.  A copy of this report was sent to the requesting provider on this date.  Electronically Signed: Greggory Keen, MD 07/19/2017, 1:57 PM   I spent a total of  15 Minutes   in face to face in clinical consultation, greater than 50% of which was counseling/coordinating care for this patient with a right sacral mass.

## 2017-07-19 NOTE — Procedures (Signed)
Rt posterior sacral mass  S/p CT core bx  No comp Stable EBL0 Path pending Full report in pacs

## 2017-07-20 ENCOUNTER — Ambulatory Visit: Admission: RE | Admit: 2017-07-20 | Payer: Medicare Other | Source: Ambulatory Visit

## 2017-07-20 ENCOUNTER — Telehealth: Payer: Self-pay | Admitting: *Deleted

## 2017-07-20 NOTE — Telephone Encounter (Signed)
Patient will noto be opened to services. She already has an agency in home [employer]) who is discharging her form services today as she has met all goals and [facility] doe snot see any need for skilled nursing.

## 2017-07-25 ENCOUNTER — Telehealth: Payer: Self-pay | Admitting: *Deleted

## 2017-07-25 NOTE — Telephone Encounter (Signed)
Daughter [name] called asking about biopsy results. I did not see results in computer, but called lab and was told specimen has been sent out for interpretation.  [name] informed of this

## 2017-07-26 ENCOUNTER — Other Ambulatory Visit: Payer: Self-pay

## 2017-07-26 ENCOUNTER — Inpatient Hospital Stay: Payer: Medicare Other | Admitting: Hematology and Oncology

## 2017-07-26 ENCOUNTER — Emergency Department
Admission: EM | Admit: 2017-07-26 | Discharge: 2017-07-26 | Disposition: A | Discharge status: Home / Self Care | Payer: Medicare Other | Attending: Emergency Medicine | Admitting: Emergency Medicine

## 2017-07-26 ENCOUNTER — Encounter: Payer: Self-pay | Admitting: Emergency Medicine

## 2017-07-26 DIAGNOSIS — I10 Essential (primary) hypertension: Secondary | ICD-10-CM | POA: Diagnosis not present

## 2017-07-26 DIAGNOSIS — Z87891 Personal history of nicotine dependence: Secondary | ICD-10-CM | POA: Insufficient documentation

## 2017-07-26 DIAGNOSIS — G20 Parkinson's disease: Secondary | ICD-10-CM | POA: Insufficient documentation

## 2017-07-26 DIAGNOSIS — Z79899 Other long term (current) drug therapy: Secondary | ICD-10-CM | POA: Diagnosis not present

## 2017-07-26 DIAGNOSIS — G8929 Other chronic pain: Secondary | ICD-10-CM

## 2017-07-26 DIAGNOSIS — M545 Low back pain, unspecified: Secondary | ICD-10-CM

## 2017-07-26 DIAGNOSIS — R531 Weakness: Secondary | ICD-10-CM | POA: Insufficient documentation

## 2017-07-26 DIAGNOSIS — K6289 Other specified diseases of anus and rectum: Secondary | ICD-10-CM | POA: Insufficient documentation

## 2017-07-26 DIAGNOSIS — E876 Hypokalemia: Secondary | ICD-10-CM | POA: Diagnosis not present

## 2017-07-26 LAB — COMPREHENSIVE METABOLIC PANEL
ALBUMIN: 3.3 g/dL — AB (ref 3.5–5.0)
ALT: 10 U/L — ABNORMAL LOW (ref 14–54)
AST: 20 U/L (ref 15–41)
Alkaline Phosphatase: 88 U/L (ref 38–126)
Anion gap: 12 (ref 5–15)
BUN: 20 mg/dL (ref 6–20)
CHLORIDE: 100 mmol/L — AB (ref 101–111)
CO2: 23 mmol/L (ref 22–32)
Calcium: 8.8 mg/dL — ABNORMAL LOW (ref 8.9–10.3)
Creatinine, Ser: 1.01 mg/dL — ABNORMAL HIGH (ref 0.44–1.00)
GFR calc Af Amer: 55 mL/min — ABNORMAL LOW (ref >60)
GFR calc non Af Amer: 48 mL/min — ABNORMAL LOW (ref >60)
GLUCOSE: 105 mg/dL — AB (ref 65–99)
POTASSIUM: 2.6 mmol/L — AB (ref 3.5–5.1)
Sodium: 135 mmol/L (ref 135–145)
Total Bilirubin: 0.7 mg/dL (ref 0.3–1.2)
Total Protein: 8.3 g/dL — ABNORMAL HIGH (ref 6.5–8.1)

## 2017-07-26 LAB — URINALYSIS, COMPLETE (UACMP) WITH MICROSCOPIC
Bilirubin Urine: NEGATIVE
Glucose, UA: NEGATIVE mg/dL
KETONES UR: NEGATIVE mg/dL
Nitrite: NEGATIVE
PH: 6 (ref 5.0–8.0)
Protein, ur: NEGATIVE mg/dL
Specific Gravity, Urine: 1.011 (ref 1.005–1.030)

## 2017-07-26 LAB — CBC WITH DIFFERENTIAL/PLATELET
Basophils Absolute: 0.1 10*3/uL (ref 0–0.1)
Basophils Relative: 1 %
Eosinophils Absolute: 0.1 10*3/uL (ref 0–0.7)
Eosinophils Relative: 2 %
HCT: 36.1 % (ref 35.0–47.0)
Hemoglobin: 11.8 g/dL — ABNORMAL LOW (ref 12.0–16.0)
LYMPHS ABS: 1.3 10*3/uL (ref 1.0–3.6)
LYMPHS PCT: 28 %
MCH: 28.1 pg (ref 26.0–34.0)
MCHC: 32.8 g/dL (ref 32.0–36.0)
MCV: 85.6 fL (ref 80.0–100.0)
MONO ABS: 0.7 10*3/uL (ref 0.2–0.9)
Monocytes Relative: 16 %
Neutro Abs: 2.4 10*3/uL (ref 1.4–6.5)
Neutrophils Relative %: 53 %
PLATELETS: 417 10*3/uL (ref 150–440)
RBC: 4.22 MIL/uL (ref 3.80–5.20)
RDW: 14.7 % — AB (ref 11.5–14.5)
WBC: 4.6 10*3/uL (ref 3.6–11.0)

## 2017-07-26 LAB — URIC ACID: Uric Acid, Serum: 8.1 mg/dL — ABNORMAL HIGH (ref 2.3–6.6)

## 2017-07-26 MED ORDER — TRAMADOL HCL 50 MG PO TABS
50.0000 mg | ORAL_TABLET | Freq: Once | ORAL | Status: AC
  Administered 2017-07-26: 50 mg via ORAL
  Filled 2017-07-26: qty 1

## 2017-07-26 MED ORDER — POTASSIUM CHLORIDE 20 MEQ PO PACK
40.0000 meq | PACK | Freq: Once | ORAL | Status: AC
  Administered 2017-07-26: 40 meq via ORAL
  Filled 2017-07-26: qty 2

## 2017-07-26 MED ORDER — POTASSIUM CHLORIDE 10 MEQ/100ML IV SOLN
10.0000 meq | Freq: Once | INTRAVENOUS | Status: AC
  Administered 2017-07-26: 10 meq via INTRAVENOUS
  Filled 2017-07-26: qty 100

## 2017-07-26 MED ORDER — POTASSIUM CHLORIDE CRYS ER 20 MEQ PO TBCR: 20.0000 meq | EXTENDED_RELEASE_TABLET | Freq: Every day | ORAL | 0 refills | Status: AC

## 2017-07-26 MED ORDER — POTASSIUM CHLORIDE CRYS ER 20 MEQ PO TBCR: 40.0000 meq | EXTENDED_RELEASE_TABLET | Freq: Once | ORAL | Status: DC

## 2017-07-26 MED ORDER — SODIUM CHLORIDE 0.9 % IV BOLUS (SEPSIS): 1000.0000 mL | Freq: Once | INTRAVENOUS | Status: DC

## 2017-07-26 NOTE — ED Triage Notes (Signed)
Pt in via POV with complaints of rectal pain, reports having biopsy performed a week ago with stiches placed internally.  Pt reports pain to rectum, requesting lidocaine to numb the area.  Pt denies following up with doctor that performed the biopsy.  Vitals WDL, NAD noted at this time.

## 2017-07-26 NOTE — Discharge Instructions (Signed)
Take your pain pills as prescribed, return to the emergency room for any new or worrisome symptoms.  We do notice your potassium is low here.  You would prefer to go home which is not unreasonable.  Please see Dr. Mike Gip tomorrow for a recheck of your potassium also follow closely with your doctor.  If you have new or worrisome symptoms please return to the emergency room.  We advise taking a pain pill before driving in the car next time.  If you have numbness or weakness, or increased pain or fever vomiting or other concerns please return to the emergency room

## 2017-07-26 NOTE — ED Notes (Signed)
Hassan Rowan from [facility] center called stating that pt daughter has called repeatedly for results of rectal mass biopsy. Hassan Rowan stated to this RN that biopsy was sent out to another facility to have testing run on it and the results won't be back until the end of the week. Message passed along to pt and family.

## 2017-07-26 NOTE — ED Provider Notes (Addendum)
[facility] Emergency Department Provider Note  ____________________________________________   I have reviewed the triage vital signs and the nursing notes. Where available I have reviewed prior notes and, if possible and indicated, outside hospital notes.    HISTORY  Chief Complaint Rectal Pain    HPI Susan Villegas is a 84 y.o. female   who has unfortunately a sacral mass, this causes her back pain;  she has been having pain for weeks in this area.  She did have a biopsy the results of which are not yet returned.  Patient was actually on the way to see Dr. Susy Manor today but bouncing up and on the car caused her pain to be worse.  She had taken a pain pill earlier today, but the pain came back when she was driving.  So her daughter "did a U-turn" and came to the emergency room.  Patient did not have any focal numbness or weakness it sounds like.  She states she had some tingling in her toes when she was bouncing up and down in the car, bilaterally.  In any event, she has no complaints right now as long as she is lying on her side.  She states usually her pain is fairly well controlled with the pain pills that she takes when she is "about due for another one".  Patient has no new or worrisome symptoms otherwise.  She denies worsening constipation vomiting fever abdominal pain change in her chronic pain, etc.   Past Medical History:  Diagnosis Date  . Arthritis   . Hypertension   . Parkinson's disease [facility])     Patient Active Problem List   Diagnosis Date Noted  . Goals of care, counseling/discussion 07/19/2017  . Sacral mass 06/29/2017    History reviewed. No pertinent surgical history.  Prior to Admission medications   Medication Sig Start Date End Date Taking? Authorizing Provider  donepezil (ARICEPT) 10 MG tablet Take 10 mg by mouth 2 (two) times daily. 07/05/17 07/05/18  [provider]  DULoxetine (CYMBALTA) 60 MG capsule Take 1 capsule by  mouth daily. 06/13/17   [provider]  felodipine (PLENDIL) 5 MG 24 hr tablet Take 1 tablet by mouth 2 (two) times daily. 06/14/17   [provider]  hydrochlorothiazide (HYDRODIURIL) 25 MG tablet Take 25 mg by mouth daily.    [provider]  HYDROcodone-acetaminophen (NORCO/VICODIN) 5-325 MG tablet Take 1 tablet by mouth every 4 (four) hours as needed for moderate pain. 07/21/15   Lavonia Drafts, MD  lidocaine (LIDODERM) 5 % Place 1 patch onto the skin every 12 (twelve) hours. Remove & Discard patch within 12 hours or as directed by MD 12/29/16 12/29/17  Merlyn Lot, MD  losartan (COZAAR) 25 MG tablet Take 1 tablet by mouth daily. 05/09/17   [provider]  oxybutynin (DITROPAN) 5 MG tablet Take 1 tablet by mouth 2 (two) times daily. 06/17/17   [provider]  polyethylene glycol (MIRALAX / GLYCOLAX) packet Take 17 g by mouth daily. 06/27/17   [provider]  potassium chloride SA (KLOR-CON M20) 20 MEQ tablet Take 1 tablet (20 mEq total) by mouth daily. 06/29/17   Hinda Kehr, MD  SYNTHROID 150 MCG tablet Take 1 tablet by mouth daily. 06/27/17   [provider]    Allergies Patient has no known allergies.  Family History  Problem Relation Age of Onset  . Cancer Sister     Social History Social History   Tobacco Use  .  Smoking status: Former Smoker    Packs/day: 0.50    Years: 15.00    Pack years: 7.50    Last attempt to quit: 1999    Years since quitting: 20.1  . Smokeless tobacco: Never Used  Substance Use Topics  . Alcohol use: No  . Drug use: No    Review of Systems Constitutional: No fever/chills Eyes: No visual changes. ENT: No sore throat. No stiff neck no neck pain Cardiovascular: Denies chest pain. Respiratory: Denies shortness of breath. Gastrointestinal:   no vomiting.  No diarrhea.  No constipation. Genitourinary: Negative for dysuria. Musculoskeletal: Negative lower extremity swelling Skin:  Negative for rash. Neurological: Negative for severe headaches, focal weakness or numbness.   ____________________________________________   PHYSICAL EXAM:  VITAL SIGNS: ED Triage Vitals  Enc Vitals Group     BP 07/26/17 1249 (!) 158/69     Pulse Rate 07/26/17 1249 96     Resp 07/26/17 1249 17     Temp 07/26/17 1249 98.2 F (36.8 C)     Temp Source 07/26/17 1249 Oral     SpO2 07/26/17 1249 99 %     Weight 07/26/17 1249 172 lb (78 kg)     Height 07/26/17 1249 5\' 3"  (1.6 m)     Head Circumference --      Peak Flow --      Pain Score 07/26/17 1301 8     Pain Loc --      Pain Edu? --      Excl. in [place]? --     Constitutional: Alert and oriented. Well appearing and in no acute distress. Eyes: Conjunctivae are normal Head: Atraumatic HEENT: No congestion/rhinnorhea. Mucous membranes are moist.  Oropharynx non-erythematous Neck:   Nontender with no meningismus, no masses, no stridor Cardiovascular: Normal rate, regular rhythm. Grossly normal heart sounds.  Good peripheral circulation. Respiratory: Normal respiratory effort.  No retractions. Lungs CTAB. Abdominal: Soft and nontender. No distention. No guarding no rebound Back: There is some paraspinal muscle tenderness in the lower back.  No deformity or step-off there is no midline tenderness there are no lesions noted. there is no CVA tenderness  Rectal exam, no obvious external masses or lesions noted Musculoskeletal: No lower extremity tenderness, no upper extremity tenderness. No joint effusions, no DVT signs strong distal pulses no edema Neurologic:  Normal speech and language. No gross focal neurologic deficits are appreciated.  Skin:  Skin is warm, dry and intact. No rash noted. Psychiatric: Mood and affect are normal. Speech and behavior are normal.  ____________________________________________   LABS (all labs ordered are listed, but only abnormal results are displayed)  Labs Reviewed  URINALYSIS, COMPLETE (UACMP)  WITH MICROSCOPIC  COMPREHENSIVE METABOLIC PANEL  CBC WITH DIFFERENTIAL/PLATELET  URIC ACID    Pertinent labs  results that were available during my care of the patient were reviewed by me and considered in my medical decision making (see chart for details). ____________________________________________  EKG  I personally interpreted any EKGs ordered by me or triage Sinus tach rate 102, normal axis no acute ST elevation or depression nonspecific ST changes, multiple PACs noted. ____________________________________________  RADIOLOGY  Pertinent labs & imaging results that were available during my care of the patient were reviewed by me and considered in my medical decision making (see chart for details). If possible, patient and/or family made aware of any abnormal findings.  No results found. ____________________________________________    PROCEDURES  Procedure(s) performed: None  Procedures  Critical Care performed: None  ____________________________________________   INITIAL IMPRESSION / ASSESSMENT AND PLAN / ED COURSE  Pertinent labs & imaging results that were available during my care of the patient were reviewed by me and considered in my medical decision making (see chart for details).  Patient here with low back pain, chronic, which was exacerbated by driving in the car today.  At this time she has no complaints but she feels that she is about due for another pain pill.t did also call Dr. Susy Manor, who states that it would be of benefit to the patient when she is seen tomorrow if we do some blood work which we have ordered.  The blood work has been ordered hopefully to facilitate care at the oncologist where they will reschedule her appointment tomorrow.  We will give her a pain pill here.  She usually has good relief with tramadol.  ----------------------------------------- 7:02 PM on 07/26/2017 -----------------------------------------  His pain is very well  controlled, family are very patient with her plan to check blood work as a prelude to visit to oncology tomorrow.  Potassium is noted to be low.  Patient does have a history of low potassium.  We are giving her aggressive repletion with IV and p.o. potassium.  I do not think the patient requires emergent admission for this she is known to have low potassium always has.  We will continue supplementation at home and we will see if her doctor can recheck it tomorrow to make sure that she is making progress.  Family are very comfortable with this plan.  They very much would prefer not to be admitted for this patient who is 74 and otherwise asymptomatic.  Thus we will discharge as soon as we get potassium on board.    ____________________________________________   FINAL CLINICAL IMPRESSION(S) / ED DIAGNOSES  Final diagnoses:  Weakness      This chart was dictated using voice recognition software.  Despite best efforts to proofread,  errors can occur which can change meaning.  '    McShane, Gerda Diss, MD 07/26/17 1619    Schuyler Amor, MD 07/26/17 1647    Schuyler Amor, MD 07/26/17 [phone]

## 2017-07-26 NOTE — ED Notes (Signed)
Pt daughter now stating pt is here for weakness, not rectal pain. Charge notified and pt moved to main.

## 2017-07-26 NOTE — ED Notes (Signed)
Pt states rectal mass biopsy. Was on the way to an appt today and states original appt was tomorrow. Unsure why appt was changed. Daughter states that pt was in so much pain so brought her here. Pt lying on L side.

## 2017-07-26 NOTE — Progress Notes (Signed)
[facility] day:  07/27/2017  Chief Complaint: Susan Villegas is a 84 y.o. female with a sacral mass who is seen for review of work-up and discussion regarding direction of therapy.  HPI: The patient was last seen in the medical oncology clinic on 07/17/2017 for initial consultation.  She had presented with a sacral mass causing pain and constipation.  Differential included myeloma or metastatic disease.   Labs on 07/17/2017 revealed a hematocrit of 33.3, hemoglobin 10.9, MCV 87.1, platelets 391,000, WBC 6000 with an ANC of 3800.  Creatinine was 1.08.  Kappa free light chains were 96.3, lambda free light chains 44.3, and ratio 2.17 (0.26-1.65).  SPEP is pending.  Beta2 microglobulin was 4.0.  LDH was 246. CEA was 2.6.  She underwent CT guided sacral biopsy on 07/19/2017.  Preliminary biopsy reveals B cell lymphoma.  She was scheduled for follow-up yesterday, but was diverted to the [facility] ER secondary to pain.  Labs revealed a potassium of 2.6 and uric acid of 8.1.  She was supplemented with oral and IV potassium.  Symptomatically, patient is doing betterl. The pain in her sacral area is not bothering her at all today. Patient has chronic back and hip pain.  Patient denies any acute concerns. She has had no B symptoms or interval infections. Patient is eating well. Her weight is up 6 pounds.   Patient has given her son a car and the house. Patient states, "He is just doggish. I have to take some of that away from him". There is still no defined HCPOA. Sister notes social discord between she and her brother. Patient has a degree of altered cognition consistent with age related senility. Advanced directives have been discussed with patient and her family, both by cancer center and home health providers. Daughter indicates that she has the papers that define her has the patient's HCPOA, however they were never signed or witnessed. Additionally, daughter  notes that her brother Susan Villegas) is not aware of the existence of this documentation.    Past Medical History:  Diagnosis Date  . Arthritis   . Hypertension   . Parkinson's disease ([place])     No past surgical history on file.  Family History  Problem Relation Age of Onset  . Cancer Sister     Social History:  reports that she quit smoking about 20 years ago. She has a 7.50 pack-year smoking history. she has never used smokeless tobacco. She reports that she does not drink alcohol or use drugs. Patient dips snuff. She is a former cigarette smoker. She quit 30 years ago.  Patient lives with her son Susan Villegas) in [place], [place]. Daughter Susan Villegas) lives in [place], [place]. The patient is accompanied by her daughter, Susan Villegas,  today.  Allergies: No Known Allergies  Current Medications: Current Outpatient Medications  Medication Sig Dispense Refill  . donepezil (ARICEPT) 10 MG tablet Take 10 mg by mouth at bedtime.     . DULoxetine (CYMBALTA) 60 MG capsule Take 1 capsule by mouth daily.  0  . felodipine (PLENDIL) 5 MG 24 hr tablet Take 1 tablet by mouth 2 (two) times daily.  1  . losartan (COZAAR) 25 MG tablet Take 1 tablet by mouth daily.    Marland Kitchen oxybutynin (DITROPAN) 5 MG tablet Take 1 tablet by mouth 2 (two) times daily.  2  . polyethylene glycol (MIRALAX / GLYCOLAX) packet Take 17 g by mouth daily.    . potassium chloride SA (KLOR-CON M20)  20 MEQ tablet Take 1 tablet (20 mEq total) by mouth daily. 7 tablet 0  . SYNTHROID 150 MCG tablet Take 1 tablet by mouth daily.    . traMADol (ULTRAM) 50 MG tablet Take 50 mg by mouth 3 (three) times daily as needed.    Marland Kitchen HYDROcodone-acetaminophen (NORCO/VICODIN) 5-325 MG tablet Take 1 tablet by mouth every 4 (four) hours as needed for moderate pain. (Patient not taking: Reported on 07/27/2017) 20 tablet 0  . lidocaine (LIDODERM) 5 % Place 1 patch onto the skin every 12 (twelve) hours. Remove & Discard patch within 12 hours or as directed by MD (Patient not  taking: Reported on 07/26/2017) 10 patch 0   No current facility-administered medications for this visit.     Review of Systems:  GENERAL:  Feels good.  Sedentary.  No fevers, sweats.  Weight up 6 pounds since last visit.  PERFORMANCE STATUS (ECOG):  3. HEENT:  No visual changes, runny nose, sore throat, mouth sores or tenderness. Lungs: No shortness of breath or cough.  No hemoptysis. Cardiac:  No chest pain, palpitations, orthopnea, or PND. GI:  Chronic constipation.  No nausea, vomiting, diarrhea, melena or hematochezia.  No prior colonoscopy. GU:  No urgency, frequency, dysuria, or hematuria.  Incontinence.  Recurrent UTIs. Musculoskeletal:  Chronic lower back pain.  Right hip pain.  Left knee issues.  No muscle tenderness. Extremities:  No pain or swelling. Skin:  No rashes or skin changes. Neuro:  Dementia.  No headache, numbness or weakness, balance or coordination issues. Endocrine:  No diabetes.  Thyroid disease on Synthroid.  No hot flashes or night sweats. Psych:  No mood changes, depression or anxiety. Pain:  No focal pain today. Review of systems:  All other systems reviewed and found to be negative.  Physical Exam: Blood pressure 131/73, pulse (!) 50, temperature (!) 97.2 F (36.2 C), temperature source Tympanic, resp. rate 18, weight 172 lb (78 kg). GENERAL:  Elderly woman sitting comfortably in a wheelchair in the exam room in no acute distress. MENTAL STATUS:  Alert and oriented to person. HEAD:  Wearing a cap.  Black curly hair.  Normocephalic, atraumatic, face symmetric, no Cushingoid features. EYES:  Glasses. Brown eyes.  No conjunctivitis or scleral icterus. NEUROLOGICAL: Memory poor.  Moves all 4 extremities. PSYCH:  Appropriate.   Admission on 07/26/2017, Discharged on 07/26/2017  Component Date Value Ref Range Status  . Color, Urine 07/26/2017 YELLOW* YELLOW Final  . APPearance 07/26/2017 CLOUDY* CLEAR Final  . Specific Gravity, Urine 07/26/2017 1.011  1.005  - 1.030 Final  . pH 07/26/2017 6.0  5.0 - 8.0 Final  . Glucose, UA 07/26/2017 NEGATIVE  NEGATIVE mg/dL Final  . Hgb urine dipstick 07/26/2017 SMALL* NEGATIVE Final  . Bilirubin Urine 07/26/2017 NEGATIVE  NEGATIVE Final  . Ketones, ur 07/26/2017 NEGATIVE  NEGATIVE mg/dL Final  . Protein, ur 07/26/2017 NEGATIVE  NEGATIVE mg/dL Final  . Nitrite 07/26/2017 NEGATIVE  NEGATIVE Final  . Leukocytes, UA 07/26/2017 SMALL* NEGATIVE Final  . RBC / HPF 07/26/2017 0-5  0 - 5 RBC/hpf Final  . WBC, UA 07/26/2017 6-30  0 - 5 WBC/hpf Final  . Bacteria, UA 07/26/2017 RARE* NONE SEEN Final  . Squamous Epithelial / LPF 07/26/2017 TOO NUMEROUS TO COUNT* NONE SEEN Final  . Mucus 07/26/2017 PRESENT   Final  . Non Squamous Epithelial 07/26/2017 0-5* NONE SEEN Final   Performed at [facility], [address] [place], [place] [address]  . Sodium 07/26/2017 135  135 -  145 mmol/L Final  . Potassium 07/26/2017 2.6* 3.5 - 5.1 mmol/L Final   Comment: CRITICAL RESULT CALLED TO, READ BACK BY AND VERIFIED WITH ALISHA GRANGER AT 1747 ON 07/26/2017 JJB   . Chloride 07/26/2017 100* 101 - 111 mmol/L Final  . CO2 07/26/2017 23  22 - 32 mmol/L Final  . Glucose, Bld 07/26/2017 105* 65 - 99 mg/dL Final  . BUN 07/26/2017 20  6 - 20 mg/dL Final  . Creatinine, Ser 07/26/2017 1.01* 0.44 - 1.00 mg/dL Final  . Calcium 07/26/2017 8.8* 8.9 - 10.3 mg/dL Final  . Total Protein 07/26/2017 8.3* 6.5 - 8.1 g/dL Final  . Albumin 07/26/2017 3.3* 3.5 - 5.0 g/dL Final  . AST 07/26/2017 20  15 - 41 U/L Final  . ALT 07/26/2017 10* 14 - 54 U/L Final  . Alkaline Phosphatase 07/26/2017 88  38 - 126 U/L Final  . Total Bilirubin 07/26/2017 0.7  0.3 - 1.2 mg/dL Final  . GFR calc non Af Amer 07/26/2017 48* >60 mL/min Final  . GFR calc Af Amer 07/26/2017 55* >60 mL/min Final   Comment: (NOTE) The eGFR has been calculated using the CKD EPI equation. This calculation has not been validated in all clinical situations. eGFR's persistently <60  mL/min signify possible Chronic Kidney Disease.   Georgiann Hahn gap 07/26/2017 12  5 - 15 Final   Performed at [facility], [address] [place], [place] [address]  . WBC 07/26/2017 4.6  3.6 - 11.0 K/uL Final  . RBC 07/26/2017 4.22  3.80 - 5.20 MIL/uL Final  . Hemoglobin 07/26/2017 11.8* 12.0 - 16.0 g/dL Final  . HCT 07/26/2017 36.1  35.0 - 47.0 % Final  . MCV 07/26/2017 85.6  80.0 - 100.0 fL Final  . MCH 07/26/2017 28.1  26.0 - 34.0 pg Final  . MCHC 07/26/2017 32.8  32.0 - 36.0 g/dL Final  . RDW 07/26/2017 14.7* 11.5 - 14.5 % Final  . Platelets 07/26/2017 417  150 - 440 K/uL Final  . Neutrophils Relative % 07/26/2017 53  % Final  . Neutro Abs 07/26/2017 2.4  1.4 - 6.5 K/uL Final  . Lymphocytes Relative 07/26/2017 28  % Final  . Lymphs Abs 07/26/2017 1.3  1.0 - 3.6 K/uL Final  . Monocytes Relative 07/26/2017 16  % Final  . Monocytes Absolute 07/26/2017 0.7  0.2 - 0.9 K/uL Final  . Eosinophils Relative 07/26/2017 2  % Final  . Eosinophils Absolute 07/26/2017 0.1  0 - 0.7 K/uL Final  . Basophils Relative 07/26/2017 1  % Final  . Basophils Absolute 07/26/2017 0.1  0 - 0.1 K/uL Final   Performed at [facility], [address] [place], [place] [address]  . Uric Acid, Serum 07/26/2017 8.1* 2.3 - 6.6 mg/dL Final   Performed at [facility], [address] [place], [place] [address]    Assessment:  Tanea Moga is a 84 y.o. female with a sacral mass s/p presenting with pain and constipation.  Preliminary pathology from CT guided biopsy on 07/19/2017 reveals diffuse large B cell lymphoma.  Abdomen and pelvic CT on 06/29/2017 revealed a 6 x 9 cm mass in the right sacrum extending anterior and posterior to the sacrum into the soft tissues. This was consistent with neoplasm and could represent myeloma or metastatic disease.   She has dementia and is followed by neurology.  She has no medical power of attorney.  Symptomatically, feels "ok" today. Patient  has no acute concerns. She has  chronic pain, however it is controlled today in clinic. Patient is eating well. Her weight is up 6 pounds.  Exam is stable.  Performance status is 3.  Plan: 1.  Labs today:  hepatitis B core antibody, hepatitis B surface antigen, hepatitis C antibody. 2.  Discuss preliminary diagnosis of diffuse large B cell lymphoma.  IPI score is high.  Discuss staging (PET scan) + echo (baseline).  Discuss standard chemotherapy options (mini-RCHOP, RCDOP, RCEPP, RGCVP).  Discuss lumbar puncture and IT prophylaxis.  Discuss palliative radiation. 3.  Discuss home health referral. Previously sent to [facility], however later learned that patient currently being covered by [facility] home health.  4.  Refer to psychiatry to determine decision making capacity. Patient and daughter in agreement.  5.  Discuss importance of medical power of attorney designation and living will decisions.  Patient and her children (son and daughter) to discuss. 6.  Discuss HYPERuricemia. Uric acid is 8.1.  Rx: allopurinol 300 mg daily.   7.  Schedule PET scan. 8.  Schedule echocardiogram.  9.  Discuss the need for patient, son, and daughter  to be present for all decision making visits. Patient states, "He (son) is going to go off his rocker".  10.  RTC on 08/01/2017 for MD assessment, review of workup and final pathology, and discussion regarding direction of therapy.    Honor Loh, NP  07/27/2017, 2:37 PM   I saw and evaluated the patient, participating in the key portions of the service and reviewing pertinent diagnostic studies and records.  I reviewed the nurse practitioner's note and agree with the findings and the plan.  The assessment and plan were discussed with the patient.  Several questions were asked by the patient and answered.   Nolon Stalls, MD 07/27/2017,2:37 PM

## 2017-07-26 NOTE — Progress Notes (Deleted)
[facility] [facility] day:  07/26/2017  Chief Complaint: Susan Villegas is a 84 y.o. female with a sacral mass who is referred by Dr. Merlyn Lot in consultation for assessment and management.  HPI: The patient was last seen in the medical oncology clinic on 07/17/2017 for initial consultation.  She had presented with a sacral mass causing pain and constipation.  Differential included myeloma or metastatic disease.   Labs on 07/17/2017 revealed a hematocrit of 33.3, hemoglobin 10.9, MCV 87.1, platelets 391,000, WBC 6000 with an ANC of 3800.  Creatinine was 1.08.  Kappa free light chains were 96.3, lambda free light chains 44.3, and ratio 2.17 (0.26-1.65).  SPEP is pending.  Beta2 microglobulin was 4.0.  LDH was 246.  CEA was 2.6.  She underwent CT guided sacral biopsy on 07/19/2017.  Preliminary biopsy reveals B cell lymphoma.    Daughter has contacted Hospice. She is asking for a referral to be sent. Patient indicates that she wishes for no heroic measures be taken to prolong her life. No HCPOA has been legally defined. There are a great deal of family dynamics between patient's children.   The patient had  2 sisters. One sister passed away from ovarian cancer, and the other from an unknown cancer.    Past Medical History:  Diagnosis Date  . Arthritis   . Hypertension   . Parkinson's disease ([place])     No past surgical history on file.  Family History  Problem Relation Age of Onset  . Cancer Sister     Social History:  reports that she quit smoking about 20 years ago. She has a 7.50 pack-year smoking history. she has never used smokeless tobacco. She reports that she does not drink alcohol or use drugs. Patient dips snuff. She is a former cigarette smoker. She quit 30 years ago.  Patient lives with her son [name]) in [place], [place]. Daughter [name]) lives in [place], [place]. The patient is accompanied by her daughter, [name],   today.  Allergies: No Known Allergies  Current Medications: Current Outpatient Medications  Medication Sig Dispense Refill  . donepezil (ARICEPT) 10 MG tablet Take 10 mg by mouth 2 (two) times daily.    . DULoxetine (CYMBALTA) 60 MG capsule Take 1 capsule by mouth daily.  0  . felodipine (PLENDIL) 5 MG 24 hr tablet Take 1 tablet by mouth 2 (two) times daily.  1  . hydrochlorothiazide (HYDRODIURIL) 25 MG tablet Take 25 mg by mouth daily.    Marland Kitchen HYDROcodone-acetaminophen (NORCO/VICODIN) 5-325 MG tablet Take 1 tablet by mouth every 4 (four) hours as needed for moderate pain. 20 tablet 0  . lidocaine (LIDODERM) 5 % Place 1 patch onto the skin every 12 (twelve) hours. Remove & Discard patch within 12 hours or as directed by MD 10 patch 0  . losartan (COZAAR) 25 MG tablet Take 1 tablet by mouth daily.    Marland Kitchen oxybutynin (DITROPAN) 5 MG tablet Take 1 tablet by mouth 2 (two) times daily.  2  . polyethylene glycol (MIRALAX / GLYCOLAX) packet Take 17 g by mouth daily.    . potassium chloride SA (KLOR-CON M20) 20 MEQ tablet Take 1 tablet (20 mEq total) by mouth daily. 7 tablet 0  . SYNTHROID 150 MCG tablet Take 1 tablet by mouth daily.     No current facility-administered medications for this visit.     Review of Systems:  GENERAL:  Feels good.  Sedentary.  No fevers, sweats.  Weight loss of 10 pounds since 04/2017. PERFORMANCE STATUS (ECOG):  3. HEENT:  No visual changes, runny nose, sore throat, mouth sores or tenderness. Lungs: No shortness of breath or cough.  No hemoptysis. Cardiac:  No chest pain, palpitations, orthopnea, or PND. GI:  Chronic constipation.  No nausea, vomiting, diarrhea, melena or hematochezia.  No prior colonoscopy. GU:  No urgency, frequency, dysuria, or hematuria.  Incontinence.  Recurrent UTIs. Musculoskeletal:  Chronic lower back pain.  Right hip pain.  Left knee issues.  No muscle tenderness. Extremities:  No pain or swelling. Skin:  No rashes or skin changes. Neuro:   Dementia.  No headache, numbness or weakness, balance or coordination issues. Endocrine:  No diabetes.  Thyroid disease on Synthroid.  No hot flashes or night sweats. Psych:  No mood changes, depression or anxiety. Pain:  Back pain (8 out of 10). Review of systems:  All other systems reviewed and found to be negative.  Physical Exam: There were no vitals taken for this visit. GENERAL:  Elderly woman sitting comfortably in a wheelchair in the exam room in no acute distress. MENTAL STATUS:  Alert and oriented to person. HEAD:  Black curly hair; wearing ballcap.  Normocephalic, atraumatic, face symmetric, no Cushingoid features. EYES:  Glasses. Brown eyes.  Pupils equal round and reactive to light and accomodation.  No conjunctivitis or scleral icterus. ENT:  Oropharynx clear without lesion.  Tongue normal. Mucous membranes moist.  RESPIRATORY:  Clear to auscultation without rales, wheezes or rhonchi. CARDIOVASCULAR:  Regular rate and rhythm without murmur, rub or gallop. ABDOMEN:  Soft, non-tender, with active bowel sounds, and no hepatosplenomegaly.  No masses. BACK:  No pain to palpation lower back, sacrum, and iliac crest. SKIN:  No rashes, ulcers or lesions. EXTREMITIES: No edema, no skin discoloration or tenderness.  No palpable cords. LYMPH NODES: No palpable cervical, supraclavicular, axillary or inguinal adenopathy  NEUROLOGICAL: Memory poor (see HPI).  Moves all 4 extremities. PSYCH:  Appropriate.   No visits with results within 3 Day(s) from this visit.  Latest known visit with results is:  Hospital Outpatient Visit on 07/19/2017  Component Date Value Ref Range Status  . aPTT 07/19/2017 29  24 - 36 seconds Final   Performed at [facility], [address] [place], [place] [address]  . WBC 07/19/2017 4.4  3.6 - 11.0 K/uL Final  . RBC 07/19/2017 4.01  3.80 - 5.20 MIL/uL Final  . Hemoglobin 07/19/2017 11.4* 12.0 - 16.0 g/dL Final  . HCT 07/19/2017 35.0  35.0 - 47.0 %  Final  . MCV 07/19/2017 87.2  80.0 - 100.0 fL Final  . MCH 07/19/2017 28.3  26.0 - 34.0 pg Final  . MCHC 07/19/2017 32.5  32.0 - 36.0 g/dL Final  . RDW 07/19/2017 14.9* 11.5 - 14.5 % Final  . Platelets 07/19/2017 385  150 - 440 K/uL Final   Performed at [facility], [address], [place], [place] [address]  . Prothrombin Time 07/19/2017 15.2  11.4 - 15.2 seconds Final  . INR 07/19/2017 1.21   Final   Performed at [facility], [address] [place], [place] [address]    Assessment:  Susan Villegas is a 84 y.o. female with a sacral mass presenting with pain and constipation.  Abdomen and pelvic CT on 06/29/2017 revealed a 6 x 9 cm mass in the right sacrum extending anterior and posterior to the sacrum into the soft tissues. This was consistent with neoplasm and could represent  myeloma or metastatic disease.   She has dementia and is followed by neurology.  She has no medical power of attorney.  Symptomatically, she has back pain and constipation.  She has lost 10 pounds since 04/2017.  Performance status is 3.  Plan: 1.  Labs today:  CBC with diff, BMP, uric acid, hepatitis B core antibody, hepatitis B surface antigen, hepatitis C antibody. 2.  Discuss preliminary diagnosis of diffuse large B cell lymphoma.  IPI score is high.  Discuss staging (PET scan) + echo (baseline).  Discuss standard chemotherapy options (mini-RCHOP, RCDOP, RCEPP, RGCVP).  Discuss lumbar puncture and IT prophylaxis.  Discuss palliative radiation.   [facility] referral. Family needs additional assistance caring for patient in the home. Will send referral to [facility].  6.  Discuss Hospice referral.  Patient and daughter informed that if she desires treatment, she would be seen by [facility] rather than Hospice.  7.  Discuss importance of medical power of attorney designation and living will decisions.  Patient and her children (son and daughter) to discuss. 8.  RTC  on 07/27/2017 for MD assessment, review of workup, and further discussion regarding direction of treatment.    Lequita Asal, MD  07/26/2017, 5:33 AM   I saw and evaluated the patient, participating in the key portions of the service and reviewing pertinent diagnostic studies and records.  I reviewed the nurse practitioner's note and agree with the findings and the plan.  The assessment and plan were discussed with the patient.  Several questions were asked by the patient and answered.   Nolon Stalls, MD 07/26/2017,5:33 AM

## 2017-07-26 NOTE — ED Provider Notes (Signed)
[facility] Emergency Department Provider Note  ____________________________________________   First MD Initiated Contact with Patient 07/26/17 1554     (approximate)  I have reviewed the triage vital signs and the nursing notes.   HISTORY  Chief Complaint Rectal Pain    HPI Susan Villegas is a 84 y.o. female presents emergency department complaining of rectal pain from a recent biopsy and weakness.  Her family members are greatly concerned that she is so weak she cannot take care of herself.  They would like to be evaluated for her to be admitted to the hospital.  They had an appointment with her regular doctor earlier today but she was in so much pain they had to turn around and come to the hospital.  The family member states that the patient has dementia  Past Medical History:  Diagnosis Date  . Arthritis   . Hypertension   . Parkinson's disease [facility])     Patient Active Problem List   Diagnosis Date Noted  . Goals of care, counseling/discussion 07/19/2017  . Sacral mass 06/29/2017    History reviewed. No pertinent surgical history.  Prior to Admission medications   Medication Sig Start Date End Date Taking? Authorizing Provider  donepezil (ARICEPT) 10 MG tablet Take 10 mg by mouth 2 (two) times daily. 07/05/17 07/05/18  [provider]  DULoxetine (CYMBALTA) 60 MG capsule Take 1 capsule by mouth daily. 06/13/17   [provider]  felodipine (PLENDIL) 5 MG 24 hr tablet Take 1 tablet by mouth 2 (two) times daily. 06/14/17   [provider]  hydrochlorothiazide (HYDRODIURIL) 25 MG tablet Take 25 mg by mouth daily.    [provider]  HYDROcodone-acetaminophen (NORCO/VICODIN) 5-325 MG tablet Take 1 tablet by mouth every 4 (four) hours as needed for moderate pain. 07/21/15   Lavonia Drafts, MD  lidocaine (LIDODERM) 5 % Place 1 patch onto the skin every 12 (twelve) hours. Remove & Discard patch within 12 hours or as  directed by MD 12/29/16 12/29/17  Merlyn Lot, MD  losartan (COZAAR) 25 MG tablet Take 1 tablet by mouth daily. 05/09/17   [provider]  oxybutynin (DITROPAN) 5 MG tablet Take 1 tablet by mouth 2 (two) times daily. 06/17/17   [provider]  polyethylene glycol (MIRALAX / GLYCOLAX) packet Take 17 g by mouth daily. 06/27/17   [provider]  potassium chloride SA (KLOR-CON M20) 20 MEQ tablet Take 1 tablet (20 mEq total) by mouth daily. 06/29/17   Hinda Kehr, MD  SYNTHROID 150 MCG tablet Take 1 tablet by mouth daily. 06/27/17   [provider]    Allergies Patient has no known allergies.  Family History  Problem Relation Age of Onset  . Cancer Sister     Social History Social History   Tobacco Use  . Smoking status: Former Smoker    Packs/day: 0.50    Years: 15.00    Pack years: 7.50    Last attempt to quit: 1999    Years since quitting: 20.1  . Smokeless tobacco: Never Used  Substance Use Topics  . Alcohol use: No  . Drug use: No    Review of Systems  Constitutional: No fever/chills, weakness  eyes: No visual changes. ENT: No sore throat. Respiratory: Denies cough Genitourinary: Negative for dysuria. Musculoskeletal: Negative for back pain. Skin: Negative for rash.    ____________________________________________   PHYSICAL EXAM:  VITAL SIGNS: ED Triage Vitals  Enc Vitals Group  BP 07/26/17 1249 (!) 158/69     Pulse Rate 07/26/17 1249 96     Resp 07/26/17 1249 17     Temp 07/26/17 1249 98.2 F (36.8 C)     Temp Source 07/26/17 1249 Oral     SpO2 07/26/17 1249 99 %     Weight 07/26/17 1249 172 lb (78 kg)     Height 07/26/17 1249 5\' 3"  (1.6 m)     Head Circumference --      Peak Flow --      Pain Score 07/26/17 1301 8     Pain Loc --      Pain Edu? --      Excl. in [place]? --     Constitutional: Alert and oriented. Well appearing and in no acute distress.  Patient gets confused with some question Eyes:  Conjunctivae are normal.  Head: Atraumatic. Nose: No congestion/rhinnorhea. Mouth/Throat: Mucous membranes are moist.   Cardiovascular: Normal rate, regular rhythm.  Heart sounds are normal Respiratory: Normal respiratory effort.  No retractions, lungs clear to auscultation GU: deferred Neurologic:  Normal speech and language.  Skin:  Skin is warm, dry and intact. No rash noted. Psychiatric: Mood and affect are normal. Speech is normal.  ____________________________________________   LABS (all labs ordered are listed, but only abnormal results are displayed)  Labs Reviewed  URINALYSIS, COMPLETE (UACMP) WITH MICROSCOPIC  COMPREHENSIVE METABOLIC PANEL  TROPONIN I  CBC WITH DIFFERENTIAL/PLATELET   ____________________________________________   ____________________________________________  RADIOLOGY   ____________________________________________   PROCEDURES  Procedure(s) performed: No  Procedures    ____________________________________________   INITIAL IMPRESSION / ASSESSMENT AND PLAN / ED COURSE  Pertinent labs & imaging results that were available during my care of the patient were reviewed by me and considered in my medical decision making (see chart for details).  Due to patient being 84 years old and complaining of weakness she was transferred to the major side     As part of my medical decision making, I reviewed the following data within the [place] History obtained from family, Nursing notes reviewed and incorporated, Notes from prior ED visits and [place] Controlled Substance Database  ____________________________________________   FINAL CLINICAL IMPRESSION(S) / ED DIAGNOSES  Final diagnoses:  Weakness      NEW MEDICATIONS STARTED DURING THIS VISIT:  New Prescriptions   No medications on file     Note:  This document was prepared using Dragon voice recognition software and may include unintentional dictation errors.      Versie Starks, PA-C 07/26/17 1613    Schuyler Amor, MD 07/26/17 [phone]

## 2017-07-27 ENCOUNTER — Inpatient Hospital Stay: Payer: Medicare Other | Attending: Hematology and Oncology | Admitting: Hematology and Oncology

## 2017-07-27 ENCOUNTER — Inpatient Hospital Stay: Payer: Medicare Other

## 2017-07-27 ENCOUNTER — Ambulatory Visit: Payer: Medicare Other | Admitting: Hematology and Oncology

## 2017-07-27 VITALS — BP 131/73 | HR 50 | Temp 97.2°F | Resp 18 | Wt 172.0 lb

## 2017-07-27 DIAGNOSIS — G893 Neoplasm related pain (acute) (chronic): Secondary | ICD-10-CM | POA: Diagnosis not present

## 2017-07-27 DIAGNOSIS — C8510 Unspecified B-cell lymphoma, unspecified site: Secondary | ICD-10-CM

## 2017-07-27 DIAGNOSIS — C8330 Diffuse large B-cell lymphoma, unspecified site: Secondary | ICD-10-CM | POA: Insufficient documentation

## 2017-07-27 DIAGNOSIS — E790 Hyperuricemia without signs of inflammatory arthritis and tophaceous disease: Secondary | ICD-10-CM | POA: Insufficient documentation

## 2017-07-27 DIAGNOSIS — Z87891 Personal history of nicotine dependence: Secondary | ICD-10-CM | POA: Insufficient documentation

## 2017-07-27 DIAGNOSIS — Z7189 Other specified counseling: Secondary | ICD-10-CM

## 2017-07-27 DIAGNOSIS — F0390 Unspecified dementia without behavioral disturbance: Secondary | ICD-10-CM | POA: Diagnosis not present

## 2017-07-27 DIAGNOSIS — G8929 Other chronic pain: Secondary | ICD-10-CM

## 2017-07-27 MED ORDER — ALLOPURINOL 300 MG PO TABS: 300.0000 mg | ORAL_TABLET | Freq: Every day | ORAL | 0 refills | Status: AC

## 2017-07-27 NOTE — Progress Notes (Signed)
Pt in today for follow up.  Seen in ED yesterday for severe sacral pain.  Denies pain at this time.

## 2017-07-28 ENCOUNTER — Other Ambulatory Visit: Payer: Self-pay | Admitting: Urgent Care

## 2017-07-28 ENCOUNTER — Encounter
Admission: RE | Admit: 2017-07-28 | Discharge: 2017-07-28 | Disposition: A | Discharge status: Home / Self Care | Payer: Medicare Other | Source: Ambulatory Visit | Attending: Urgent Care | Admitting: Urgent Care

## 2017-07-28 DIAGNOSIS — C8510 Unspecified B-cell lymphoma, unspecified site: Secondary | ICD-10-CM | POA: Insufficient documentation

## 2017-07-28 DIAGNOSIS — Z789 Other specified health status: Secondary | ICD-10-CM

## 2017-07-28 LAB — HEPATITIS C ANTIBODY

## 2017-07-28 LAB — HEPATITIS B SURFACE ANTIGEN: HEP B S AG: NEGATIVE

## 2017-07-28 LAB — HEPATITIS B CORE ANTIBODY, TOTAL: Hep B Core Total Ab: NEGATIVE

## 2017-07-28 LAB — GLUCOSE, CAPILLARY: GLUCOSE-CAPILLARY: 103 mg/dL — AB (ref 65–99)

## 2017-07-28 MED ORDER — FLUDEOXYGLUCOSE F - 18 (FDG) INJECTION
8.5500 | Freq: Once | INTRAVENOUS | Status: AC | PRN
  Administered 2017-07-28: 8.55 via INTRAVENOUS

## 2017-07-30 ENCOUNTER — Encounter: Payer: Self-pay | Admitting: Hematology and Oncology

## 2017-07-30 DIAGNOSIS — E790 Hyperuricemia without signs of inflammatory arthritis and tophaceous disease: Secondary | ICD-10-CM | POA: Insufficient documentation

## 2017-08-01 ENCOUNTER — Inpatient Hospital Stay (HOSPITAL_BASED_OUTPATIENT_CLINIC_OR_DEPARTMENT_OTHER): Payer: Medicare Other | Admitting: Hematology and Oncology

## 2017-08-01 ENCOUNTER — Other Ambulatory Visit (INDEPENDENT_AMBULATORY_CARE_PROVIDER_SITE_OTHER): Payer: Self-pay | Admitting: Vascular Surgery

## 2017-08-01 ENCOUNTER — Encounter (INDEPENDENT_AMBULATORY_CARE_PROVIDER_SITE_OTHER): Payer: Self-pay

## 2017-08-01 VITALS — BP 144/75 | HR 89 | Temp 98.2°F | Resp 18

## 2017-08-01 DIAGNOSIS — Z87891 Personal history of nicotine dependence: Secondary | ICD-10-CM | POA: Diagnosis not present

## 2017-08-01 DIAGNOSIS — E790 Hyperuricemia without signs of inflammatory arthritis and tophaceous disease: Secondary | ICD-10-CM

## 2017-08-01 DIAGNOSIS — Z7189 Other specified counseling: Secondary | ICD-10-CM

## 2017-08-01 DIAGNOSIS — G893 Neoplasm related pain (acute) (chronic): Secondary | ICD-10-CM | POA: Diagnosis not present

## 2017-08-01 DIAGNOSIS — C8330 Diffuse large B-cell lymphoma, unspecified site: Secondary | ICD-10-CM

## 2017-08-01 NOTE — Progress Notes (Signed)
[facility] day:  08/01/2017  Chief Complaint: Susan Villegas is a 84 y.o. female with diffuse large B cell lymphoma who is seen for review of work-up and discussion regarding direction of therapy.  HPI: The patient was last seen in the medical oncology clinic on 07/27/2017.  At that time, she felt "ok".  She had no acute concerns. Chronic pain was controlled.  She was eating well. Weight was up 6 pounds.  Exam was stable.  Uric acid was 8.1.  She was started on allopurinol.  We had a preliminary discussion regarding a diagnosis of lymphoma.  She was scheduled for PET scan and echo.  Competency and medical power of attorney issues were discussed.  Hepatitis B core antibody total, hepatitis B surface antigen, and hepatitis C antibody were negative on 07/27/2017.  PET scan on 07/28/2017 revealed an aggressive lytic mass involving the entire sacrum as well soft tissue extension in the presacral space with intense metabolic activity consistent with lymphoma. There was a single small LEFT internal iliac metastatic lymph node, but intensely hypermetabolic.  There was no evidence of metastatic disease outside the pelvis. Spleen was normal.  There was metabolic activity associated posterior LEFT eleventh rib that was favored benign posttraumatic activity.  Pathology revealed a large B cell lymphoma.  CD20 was diffusely and  strongly immunoreactive. The immunohistochemical (IHC) profile was  compatible with diffuse large B-cell lymphoma, non-GCB type, with co-expression of BCL2 (>90% of neoplastic cells) and MYC (about 50%) by IHC. Ki67 index was high, about 90%. MUM 1 and BCL6 are immunoreactive while CD10 was negative. CD5 was non-contributory. FISH for MYC gene rearrangement, CD30, and EBER was obtained and final classification will be based on these results and reported in an addendum.   Echo is scheduled for 08/02/2017.  Competency has not been  addressed.  Symptomatically, patient is doing "ok". She has no acute concerns today. Patient is noted to be leaning to the LEFT side today. Patient positioning herself on LEFT hip to relieve pressure on her sacral area, which notably is where she has a mass. She also complains of chronic pain in her RIGHT hip.  She denies any fevers, sweats or weight loss. Patient continues to eat well.    Past Medical History:  Diagnosis Date  . Arthritis   . Hypertension   . Parkinson's disease ([place])     No past surgical history on file.  Family History  Problem Relation Age of Onset  . Cancer Sister     Social History:  reports that she quit smoking about 20 years ago. She has a 7.50 pack-year smoking history. she has never used smokeless tobacco. She reports that she does not drink alcohol or use drugs. Patient dips snuff. She is a former cigarette smoker. She quit 30 years ago.  Patient lives with her son [name]) in [place], [place]. Daughter [name]) lives in [place], [place]. The patient is accompanied by her son [name]) and daughter [name]) today.  Allergies: No Known Allergies  Current Medications: Current Outpatient Medications  Medication Sig Dispense Refill  . allopurinol (ZYLOPRIM) 300 MG tablet Take 1 tablet (300 mg total) by mouth daily. 30 tablet 0  . donepezil (ARICEPT) 10 MG tablet Take 10 mg by mouth at bedtime.     . DULoxetine (CYMBALTA) 60 MG capsule Take 1 capsule by mouth daily.  0  . felodipine (PLENDIL) 5 MG 24 hr tablet Take 1 tablet by mouth 2 (two) times  daily.  1  . HYDROcodone-acetaminophen (NORCO/VICODIN) 5-325 MG tablet Take 1 tablet by mouth every 4 (four) hours as needed for moderate pain. 20 tablet 0  . lidocaine (LIDODERM) 5 % Place 1 patch onto the skin every 12 (twelve) hours. Remove & Discard patch within 12 hours or as directed by MD 10 patch 0  . losartan (COZAAR) 25 MG tablet Take 1 tablet by mouth daily.    Marland Kitchen oxybutynin (DITROPAN) 5 MG tablet Take 1 tablet by  mouth 2 (two) times daily.  2  . polyethylene glycol (MIRALAX / GLYCOLAX) packet Take 17 g by mouth daily.    . potassium chloride SA (KLOR-CON M20) 20 MEQ tablet Take 1 tablet (20 mEq total) by mouth daily. 7 tablet 0  . SYNTHROID 150 MCG tablet Take 1 tablet by mouth daily.    . traMADol (ULTRAM) 50 MG tablet Take 50 mg by mouth 3 (three) times daily as needed.     No current facility-administered medications for this visit.     Review of Systems:  GENERAL:  Feels "ok".  Sedentary.  No fevers, sweats.  No new weight obtained today.  PERFORMANCE STATUS (ECOG):  3. HEENT:  No visual changes, runny nose, sore throat, mouth sores or tenderness. Lungs: No shortness of breath or cough.  No hemoptysis. Cardiac:  No chest pain, palpitations, orthopnea, or PND. GI:  Chronic constipation.  No nausea, vomiting, diarrhea, melena or hematochezia.  No prior colonoscopy. GU:  No urgency, frequency, dysuria, or hematuria.  Incontinence.  Recurrent UTIs. Musculoskeletal:  Chronic lower back pain.  Right hip pain.  Left knee issues.  No muscle tenderness. Extremities:  No pain or swelling. Skin:  No rashes or skin changes. Neuro:  Dementia.  No headache, numbness or weakness, balance or coordination issues. Endocrine:  No diabetes.  Thyroid disease on Synthroid.  No hot flashes or night sweats. Psych:  No mood changes, depression or anxiety. Pain:  No focal pain today. Review of systems:  All other systems reviewed and found to be negative.  Physical Exam: Blood pressure (!) 144/75, pulse 89, temperature 98.2 F (36.8 C), temperature source Tympanic, resp. rate 18. GENERAL:  Elderly woman sitting comfortably in a wheelchair in the exam room in no acute distress. MENTAL STATUS:  Alert and oriented to person. HEAD:  Wearing a cap.  Black curly hair.  Normocephalic, atraumatic, face symmetric, no Cushingoid features. EYES:  Glasses. Brown eyes.  No conjunctivitis or scleral icterus. NEUROLOGICAL:  Memory poor.  Moves all 4 extremities. PSYCH:  Appropriate. Engaging.  Answers questions appropriately.   No visits with results within 3 Day(s) from this visit.  Latest known visit with results is:  Hospital Outpatient Visit on 07/28/2017  Component Date Value Ref Range Status  . Glucose-Capillary 07/28/2017 103* 65 - 99 mg/dL Final    Assessment:  Makylah Bossard Cariker is a 84 y.o. female with a diffuse large B cell lymphoma s/p CT guided biopsy on 07/19/2017.  She presented with a sacral mass resulting in pain and constipation.    Pathology revealed a large B cell lymphoma.  CD20 was diffusely and  strongly immunoreactive. The immunohistochemical (IHC) profile was  compatible with diffuse large B-cell lymphoma, non-GCB type, with co-expression of BCL2 (>90% of neoplastic cells) and MYC (about 50%) by IHC. Ki67 index was high, about 90%. MUM 1 and BCL6 are immunoreactive while CD10 was negative. CD5 was non-contributory.   Abdomen and pelvic CT on 06/29/2017 revealed a 6 x 9 cm  mass in the right sacrum extending anterior and posterior to the sacrum into the soft tissues. This was consistent with neoplasm and could represent myeloma or metastatic disease.   Hepatitis B core antibody total, hepatitis B surface antigen, and hepatitis C antibody were negative on 07/27/2017.  She has dementia and is followed by neurology.  She has no medical power of attorney.  Symptomatically, feels "ok" today. Patient has no acute concerns. She has chronic pain in her RIGHT hip. She also has pain in her sacral area related to her cancer. Patient is eating well. Exam is stable.  Performance status is 3.  Plan: 1.  Discuss diagnosis of diffuse large B cell lymphoma.  She has stage IV based on bone/bone marrow involvement.  Discuss no treatment/Hospice, radiation for palliation (pain), and systemic chemotherapy.  Chemotherapy options (mini-RCHOP, RCDOP, RCEPP, RGCVP) discussed.  Await results of echo to determine  if she is a candidate for chemotherapy.  Side effects of multiple agents reviewed.  Discussed myelosuppression, nausea,vomiting, hair loss, fever/infections, weakness, and fatigue.  Discuss she could have life threatening issues given her age.   Discuss results from multiple trials involving elderly patients.  Response rates 60-70%.  Discuss 1-2 year progression free survival of 50-60%.  Multiple questions were asked and answered.  After some discussion, patient stated she wanted chemotherapy.  Her son agreed.  Her daughter stated "whatever she (the patient) wants".   2.  We discussed follow-up with Dr. Melrose Nakayama, her neurologist, for mental competency, determination of medical power of attorney, and code status issues.  I discussed the need to have all parties present (patient, son and daughter).  They are in agreement. 3.  Echocardiogram scheduled for 08/02/2017. 4.  Discuss code status. DNR/DNI is in place with Dr. Edwina Barth. Obtain copy.  5.  Schedule chemotherapy class. 6.  Refer to vascular for port placement.  7.  RTC in 1 week for MD assessment, labs (CBC with diff, CMP, uric acid), and further discussion regarding treatment.    Honor Loh, NP  08/01/2017, 10:40 AM   I saw and evaluated the patient, participating in the key portions of the service and reviewing pertinent diagnostic studies and records.  I reviewed the nurse practitioner's note and agree with the findings and the plan.  The assessment and plan were discussed with the patient.  Numerous questions were asked by the patient and answered.   Nolon Stalls, MD 08/01/2017,10:40 AM

## 2017-08-01 NOTE — Progress Notes (Signed)
Patient accompanied by son and daughter today.  Daughter states patient threw up a couple of times a day or so ago.  Patient denies any pain today except for diabetic neuropathy in her feet.

## 2017-08-02 ENCOUNTER — Ambulatory Visit
Admission: RE | Admit: 2017-08-02 | Discharge: 2017-08-02 | Disposition: A | Discharge status: Home / Self Care | Payer: Medicare Other | Source: Ambulatory Visit | Attending: Urgent Care | Admitting: Urgent Care

## 2017-08-02 ENCOUNTER — Encounter: Payer: Self-pay | Admitting: Hematology and Oncology

## 2017-08-02 DIAGNOSIS — I119 Hypertensive heart disease without heart failure: Secondary | ICD-10-CM | POA: Insufficient documentation

## 2017-08-02 DIAGNOSIS — C8510 Unspecified B-cell lymphoma, unspecified site: Secondary | ICD-10-CM | POA: Insufficient documentation

## 2017-08-02 NOTE — Progress Notes (Signed)
*  PRELIMINARY RESULTS* Echocardiogram 2D Echocardiogram has been performed.  Susan Villegas 08/02/2017, 12:15 PM

## 2017-08-03 ENCOUNTER — Telehealth: Payer: Self-pay | Admitting: Urgent Care

## 2017-08-03 ENCOUNTER — Other Ambulatory Visit: Payer: Self-pay | Admitting: Urgent Care

## 2017-08-03 ENCOUNTER — Inpatient Hospital Stay: Payer: Medicare Other

## 2017-08-03 NOTE — Telephone Encounter (Signed)
Call received earlier today from patient's daughter to discuss her plan of care.  Daughter wishing to discuss several aspects of her mother's care.  1.  Chemotherapy class - patient was scheduled to attend chemotherapy education class today at 1400 with [facility], Therapist, sports.  Just prior to her appointment, patient was reported to have become combative.  Daughter states, "she got down in the floor and I cannot get her up.  We are going to have to miss that class.  I do not know what to do".  Later in the day I learned that daughter had to call EMS to assist patient from the floor back into bed.  There were no injuries noted by EMS.  Additionally, when I spoke with Basilia Jumbo, RN about the patient missing class, I was informed that patient's son attended the education session.   2.  Living arrangements - daughter noted that patient currently lives with her son [name].  Daughter states, "I want her out of her living situation.  I need to know what to do.  I need someone to help me with this".  Daughter denied concerns related to patient's immediate safety.  There were no concerns identified for abuse or neglect.  Daughter was advised that options were limited if the patient was being taken care of and her needs were being met.  Suggested that daughter speak with the [facility] Adult Protective Services for further guidance.  3.  Decision-making capacity - patient has a documented diagnosis of dementia.  She is followed by neurology Melrose Nakayama).  Attempts have been made to arrange for a formal assessment of patient's decision making capacity, however we have been unable to locate a psychiatrist on an outpatient basis.  A formal referral has been sent to [facility] to have patient seen by her neurologist to discuss ability to make safe and informed decisions.  We have also asked for assistance with defining of a legal HCPOA prior to beginning treatments.  The patient is scheduled to see Dr. Melrose Nakayama  on Monday, 08/07/2017.  4.  Hospice services vs treatment -patient's daughter expresses concern with proceeding with treatment for her mother's newly diagnosed lymphoma.  She was reminded that when we met with her, her mother, and her brother that all parties agreed to proceeding with treatment.  Daughter notes that she has changed her mind and she does not feel that the patient will be able to tolerate treatment successfully.  As previously documented, the patient has age-related cognitive deficits with varying periods of lucidity.  Again, it is beyond the scope of this provider to comment on patient's ability to make decisions regarding her own health at this point.  Daughter has contacted hospice, however when they learned that the patient was formally pursuing treatment, a representative from the hospice office called and asked that orders be sent over for palliative care.  Despite prior conversations, there is now an obvious disconnect between the siblings regarding the decision to treat versus allowing the patient's disease to run its natural course. The patient is scheduled for port access placement on Monday afternoon.  Given the fact that we have not determined patient's decision-making capacity, nor have we legally defined a HCPOA, I have canceled the patient's appointment for Monday to have her port placed.  Plans are to regroup on 08/08/2017 here in the medical oncology clinic to discuss plan of care going forward, at this point we should have input from neurology.   All the aforementioned concerns  have been discussed with clinic leadership.  Management agrees with my recommendations that decision-making capacity assessment and HCPOA be defined prior to pursuing any further treatment decisions.  Management aware that patient has a scheduled RTC visit on 08/08/2017.  Rather than starting chemotherapy that day, we will plan on having further discussions regarding the patient's plan of care.  As   previously discussed with the patient's family, all parties need to be present for the patient's clinic visits until a HCPOA is defined.  I have expressed my concerns to management about patient's ability to be able to tolerate extended IV chemotherapy treatments here in the infusion center.  Intravenous chemotherapy treatment regimen will take hours to infuse.  With the patient being combative and laying in the floor refusing to get up earlier today, we must consider the challenges associated with treatment here in the infusion center.  I will plan to discuss this with primary oncologist,  Dr. Mike Gip.

## 2017-08-03 NOTE — Patient Instructions (Signed)
Rituximab injection What is this medicine? RITUXIMAB (ri TUX i mab) is a monoclonal antibody. It is used to treat certain types of cancer like non-Hodgkin lymphoma and chronic lymphocytic leukemia. It is also used to treat rheumatoid arthritis, granulomatosis with polyangiitis (or Wegener's granulomatosis), and microscopic polyangiitis. This medicine may be used for other purposes; ask your health care provider or pharmacist if you have questions. COMMON BRAND NAME(S): Rituxan What should I tell my health care provider before I take this medicine? They need to know if you have any of these conditions: -heart disease -infection (especially a virus infection such as hepatitis B, chickenpox, cold sores, or herpes) -immune system problems -irregular heartbeat -kidney disease -lung or breathing disease, like asthma -recently received or scheduled to receive a vaccine -an unusual or allergic reaction to rituximab, mouse proteins, other medicines, foods, dyes, or preservatives -pregnant or trying to get pregnant -breast-feeding How should I use this medicine? This medicine is for infusion into a vein. It is administered in a hospital or clinic by a specially trained health care professional. A special MedGuide will be given to you by the pharmacist with each prescription and refill. Be sure to read this information carefully each time. Talk to your pediatrician regarding the use of this medicine in children. This medicine is not approved for use in children. Overdosage: If you think you have taken too much of this medicine contact a poison control center or emergency room at once. NOTE: This medicine is only for you. Do not share this medicine with others. What if I miss a dose? It is important not to miss a dose. Call your doctor or health care professional if you are unable to keep an appointment. What may interact with this medicine? -cisplatin -other medicines for arthritis like disease  modifying antirheumatic drugs or tumor necrosis factor inhibitors -live virus vaccines This list may not describe all possible interactions. Give your health care provider a list of all the medicines, herbs, non-prescription drugs, or dietary supplements you use. Also tell them if you smoke, drink alcohol, or use illegal drugs. Some items may interact with your medicine. What should I watch for while using this medicine? Your condition will be monitored carefully while you are receiving this medicine. You may need blood work done while you are taking this medicine. This medicine can cause serious allergic reactions. To reduce your risk you may need to take medicine before treatment with this medicine. Take your medicine as directed. In some patients, this medicine may cause a serious brain infection that may cause death. If you have any problems seeing, thinking, speaking, walking, or standing, tell your doctor right away. If you cannot reach your doctor, urgently seek other source of medical care. Call your doctor or health care professional for advice if you get a fever, chills or sore throat, or other symptoms of a cold or flu. Do not treat yourself. This drug decreases your body's ability to fight infections. Try to avoid being around people who are sick. Do not become pregnant while taking this medicine or for 12 months after stopping it. Women should inform their doctor if they wish to become pregnant or think they might be pregnant. There is a potential for serious side effects to an unborn child. Talk to your health care professional or pharmacist for more information. What side effects may I notice from receiving this medicine? Side effects that you should report to your doctor or health care professional as soon as possible: -breathing   problems -chest pain -dizziness or feeling faint -fast, irregular heartbeat -low blood counts - this medicine may decrease the number of white blood cells,  red blood cells and platelets. You may be at increased risk for infections and bleeding. -mouth sores -redness, blistering, peeling or loosening of the skin, including inside the mouth (this can be added for any serious or exfoliative rash that could lead to hospitalization) -signs of infection - fever or chills, cough, sore throat, pain or difficulty passing urine -signs and symptoms of kidney injury like trouble passing urine or change in the amount of urine -signs and symptoms of liver injury like dark yellow or brown urine; general ill feeling or flu-like symptoms; light-colored stools; loss of appetite; nausea; right upper belly pain; unusually weak or tired; yellowing of the eyes or skin -stomach pain -vomiting Side effects that usually do not require medical attention (report to your doctor or health care professional if they continue or are bothersome): -headache -joint pain -muscle cramps or muscle pain This list may not describe all possible side effects. Call your doctor for medical advice about side effects. You may report side effects to FDA at 1-800-FDA-1088. Where should I keep my medicine? This drug is given in a hospital or clinic and will not be stored at home. NOTE: This sheet is a summary. It may not cover all possible information. If you have questions about this medicine, talk to your doctor, pharmacist, or health care provider.  2018 Elsevier/Gold Standard (2015-12-16 15:28:09) Doxorubicin injection What is this medicine? DOXORUBICIN (dox oh ROO bi sin) is a chemotherapy drug. It is used to treat many kinds of cancer like leukemia, lymphoma, neuroblastoma, sarcoma, and Wilms' tumor. It is also used to treat bladder cancer, breast cancer, lung cancer, ovarian cancer, stomach cancer, and thyroid cancer. This medicine may be used for other purposes; ask your health care provider or pharmacist if you have questions. COMMON BRAND NAME(S): Adriamycin, Adriamycin PFS, Adriamycin  RDF, Rubex What should I tell my health care provider before I take this medicine? They need to know if you have any of these conditions: -heart disease -history of low blood counts caused by a medicine -liver disease -recent or ongoing radiation therapy -an unusual or allergic reaction to doxorubicin, other chemotherapy agents, other medicines, foods, dyes, or preservatives -pregnant or trying to get pregnant -breast-feeding How should I use this medicine? This drug is given as an infusion into a vein. It is administered in a hospital or clinic by a specially trained health care professional. If you have pain, swelling, burning or any unusual feeling around the site of your injection, tell your health care professional right away. Talk to your pediatrician regarding the use of this medicine in children. Special care may be needed. Overdosage: If you think you have taken too much of this medicine contact a poison control center or emergency room at once. NOTE: This medicine is only for you. Do not share this medicine with others. What if I miss a dose? It is important not to miss your dose. Call your doctor or health care professional if you are unable to keep an appointment. What may interact with this medicine? This medicine may interact with the following medications: -6-mercaptopurine -paclitaxel -phenytoin -St. John's Wort -trastuzumab -verapamil This list may not describe all possible interactions. Give your health care provider a list of all the medicines, herbs, non-prescription drugs, or dietary supplements you use. Also tell them if you smoke, drink alcohol, or use illegal drugs.   Some items may interact with your medicine. What should I watch for while using this medicine? This drug may make you feel generally unwell. This is not uncommon, as chemotherapy can affect healthy cells as well as cancer cells. Report any side effects. Continue your course of treatment even though you  feel ill unless your doctor tells you to stop. There is a maximum amount of this medicine you should receive throughout your life. The amount depends on the medical condition being treated and your overall health. Your doctor will watch how much of this medicine you receive in your lifetime. Tell your doctor if you have taken this medicine before. You may need blood work done while you are taking this medicine. Your urine may turn red for a few days after your dose. This is not blood. If your urine is dark or brown, call your doctor. In some cases, you may be given additional medicines to help with side effects. Follow all directions for their use. Call your doctor or health care professional for advice if you get a fever, chills or sore throat, or other symptoms of a cold or flu. Do not treat yourself. This drug decreases your body's ability to fight infections. Try to avoid being around people who are sick. This medicine may increase your risk to bruise or bleed. Call your doctor or health care professional if you notice any unusual bleeding. Talk to your doctor about your risk of cancer. You may be more at risk for certain types of cancers if you take this medicine. Do not become pregnant while taking this medicine or for 6 months after stopping it. Women should inform their doctor if they wish to become pregnant or think they might be pregnant. Men should not father a child while taking this medicine and for 6 months after stopping it. There is a potential for serious side effects to an unborn child. Talk to your health care professional or pharmacist for more information. Do not breast-feed an infant while taking this medicine. This medicine has caused ovarian failure in some women and reduced sperm counts in some men This medicine may interfere with the ability to have a child. Talk with your doctor or health care professional if you are concerned about your fertility. What side effects may I notice  from receiving this medicine? Side effects that you should report to your doctor or health care professional as soon as possible: -allergic reactions like skin rash, itching or hives, swelling of the face, lips, or tongue -breathing problems -chest pain -fast or irregular heartbeat -low blood counts - this medicine may decrease the number of white blood cells, red blood cells and platelets. You may be at increased risk for infections and bleeding. -pain, redness, or irritation at site where injected -signs of infection - fever or chills, cough, sore throat, pain or difficulty passing urine -signs of decreased platelets or bleeding - bruising, pinpoint red spots on the skin, black, tarry stools, blood in the urine -swelling of the ankles, feet, hands -tiredness -weakness Side effects that usually do not require medical attention (report to your doctor or health care professional if they continue or are bothersome): -diarrhea -hair loss -mouth sores -nail discoloration or damage -nausea -red colored urine -vomiting This list may not describe all possible side effects. Call your doctor for medical advice about side effects. You may report side effects to FDA at 1-800-FDA-1088. Where should I keep my medicine? This drug is given in a hospital or clinic   and will not be stored at home. NOTE: This sheet is a summary. It may not cover all possible information. If you have questions about this medicine, talk to your doctor, pharmacist, or health care provider.  2018 Elsevier/Gold Standard (2015-07-06 11:28:51) Vincristine injection What is this medicine? VINCRISTINE (vin KRIS teen) is a chemotherapy drug. It slows the growth of cancer cells. This medicine is used to treat many types of cancer like Hodgkin's disease, leukemia, non-Hodgkin's lymphoma, neuroblastoma (brain cancer), rhabdomyosarcoma, and Wilms' tumor. This medicine may be used for other purposes; ask your health care provider or  pharmacist if you have questions. COMMON BRAND NAME(S): Oncovin, Vincasar PFS What should I tell my health care provider before I take this medicine? They need to know if you have any of these conditions: -blood disorders -gout -infection (especially chickenpox, cold sores, or herpes) -kidney disease -liver disease -lung disease -nervous system disease like Charcot-Marie-Tooth (CMT) -recent or ongoing radiation therapy -an unusual or allergic reaction to vincristine, other chemotherapy agents, other medicines, foods, dyes, or preservatives -pregnant or trying to get pregnant -breast-feeding How should I use this medicine? This drug is given as an infusion into a vein. It is administered in a hospital or clinic by a specially trained health care professional. If you have pain, swelling, burning, or any unusual feeling around the site of your injection, tell your health care professional right away. Talk to your pediatrician regarding the use of this medicine in children. While this drug may be prescribed for selected conditions, precautions do apply. Overdosage: If you think you have taken too much of this medicine contact a poison control center or emergency room at once. NOTE: This medicine is only for you. Do not share this medicine with others. What if I miss a dose? It is important not to miss your dose. Call your doctor or health care professional if you are unable to keep an appointment. What may interact with this medicine? Do not take this medicine with any of the following medications: -itraconazole -mibefradil -voriconazole This medicine may also interact with the following medications: -cyclosporine -erythromycin -fluconazole -ketoconazole -medicines for HIV like delavirdine, efavirenz, nevirapine -medicines for seizures like ethotoin, fosphenotoin, phenytoin -medicines to increase blood counts like filgrastim, pegfilgrastim, sargramostim -other chemotherapy drugs like  cisplatin, L-asparaginase, methotrexate, mitomycin, paclitaxel -pegaspargase -vaccines -zalcitabine, ddC Talk to your doctor or health care professional before taking any of these medicines: -acetaminophen -aspirin -ibuprofen -ketoprofen -naproxen This list may not describe all possible interactions. Give your health care provider a list of all the medicines, herbs, non-prescription drugs, or dietary supplements you use. Also tell them if you smoke, drink alcohol, or use illegal drugs. Some items may interact with your medicine. What should I watch for while using this medicine? Your condition will be monitored carefully while you are receiving this medicine. You will need important blood work done while you are taking this medicine. This drug may make you feel generally unwell. This is not uncommon, as chemotherapy can affect healthy cells as well as cancer cells. Report any side effects. Continue your course of treatment even though you feel ill unless your doctor tells you to stop. In some cases, you may be given additional medicines to help with side effects. Follow all directions for their use. Call your doctor or health care professional for advice if you get a fever, chills or sore throat, or other symptoms of a cold or flu. Do not treat yourself. Avoid taking products that contain aspirin, acetaminophen, ibuprofen,   naproxen, or ketoprofen unless instructed by your doctor. These medicines may hide a fever. Do not become pregnant while taking this medicine. Women should inform their doctor if they wish to become pregnant or think they might be pregnant. There is a potential for serious side effects to an unborn child. Talk to your health care professional or pharmacist for more information. Do not breast-feed an infant while taking this medicine. Men may have a lower sperm count while taking this medicine. Talk to your doctor if you plan to father a child. What side effects may I notice from  receiving this medicine? Side effects that you should report to your doctor or health care professional as soon as possible: -allergic reactions like skin rash, itching or hives, swelling of the face, lips, or tongue -breathing problems -confusion or changes in emotions or moods -constipation -cough -mouth sores -muscle weakness -nausea and vomiting -pain, swelling, redness or irritation at the injection site -pain, tingling, numbness in the hands or feet -problems with balance, talking, walking -seizures -stomach pain -trouble passing urine or change in the amount of urine Side effects that usually do not require medical attention (report to your doctor or health care professional if they continue or are bothersome): -diarrhea -hair loss -jaw pain -loss of appetite This list may not describe all possible side effects. Call your doctor for medical advice about side effects. You may report side effects to FDA at 1-800-FDA-1088. Where should I keep my medicine? This drug is given in a hospital or clinic and will not be stored at home. NOTE: This sheet is a summary. It may not cover all possible information. If you have questions about this medicine, talk to your doctor, pharmacist, or health care provider.  2018 Elsevier/Gold Standard (2008-02-04 17:17:13) Cyclophosphamide injection What is this medicine? CYCLOPHOSPHAMIDE (sye kloe FOSS fa mide) is a chemotherapy drug. It slows the growth of cancer cells. This medicine is used to treat many types of cancer like lymphoma, myeloma, leukemia, breast cancer, and ovarian cancer, to name a few. This medicine may be used for other purposes; ask your health care provider or pharmacist if you have questions. COMMON BRAND NAME(S): Cytoxan, Neosar What should I tell my health care provider before I take this medicine? They need to know if you have any of these conditions: -blood disorders -history of other chemotherapy -infection -kidney  disease -liver disease -recent or ongoing radiation therapy -tumors in the bone marrow -an unusual or allergic reaction to cyclophosphamide, other chemotherapy, other medicines, foods, dyes, or preservatives -pregnant or trying to get pregnant -breast-feeding How should I use this medicine? This drug is usually given as an injection into a vein or muscle or by infusion into a vein. It is administered in a hospital or clinic by a specially trained health care professional. Talk to your pediatrician regarding the use of this medicine in children. Special care may be needed. Overdosage: If you think you have taken too much of this medicine contact a poison control center or emergency room at once. NOTE: This medicine is only for you. Do not share this medicine with others. What if I miss a dose? It is important not to miss your dose. Call your doctor or health care professional if you are unable to keep an appointment. What may interact with this medicine? This medicine may interact with the following medications: -amiodarone -amphotericin B -azathioprine -certain antiviral medicines for HIV or AIDS such as protease inhibitors (e.g., indinavir, ritonavir) and zidovudine -certain blood   pressure medications such as benazepril, captopril, enalapril, fosinopril, lisinopril, moexipril, monopril, perindopril, quinapril, ramipril, trandolapril -certain cancer medications such as anthracyclines (e.g., daunorubicin, doxorubicin), busulfan, cytarabine, paclitaxel, pentostatin, tamoxifen, trastuzumab -certain diuretics such as chlorothiazide, chlorthalidone, hydrochlorothiazide, indapamide, metolazone -certain medicines that treat or prevent blood clots like warfarin -certain muscle relaxants such as succinylcholine -cyclosporine -etanercept -indomethacin -medicines to increase blood counts like filgrastim, pegfilgrastim, sargramostim -medicines used as general  anesthesia -metronidazole -natalizumab This list may not describe all possible interactions. Give your health care provider a list of all the medicines, herbs, non-prescription drugs, or dietary supplements you use. Also tell them if you smoke, drink alcohol, or use illegal drugs. Some items may interact with your medicine. What should I watch for while using this medicine? Visit your doctor for checks on your progress. This drug may make you feel generally unwell. This is not uncommon, as chemotherapy can affect healthy cells as well as cancer cells. Report any side effects. Continue your course of treatment even though you feel ill unless your doctor tells you to stop. Drink water or other fluids as directed. Urinate often, even at night. In some cases, you may be given additional medicines to help with side effects. Follow all directions for their use. Call your doctor or health care professional for advice if you get a fever, chills or sore throat, or other symptoms of a cold or flu. Do not treat yourself. This drug decreases your body's ability to fight infections. Try to avoid being around people who are sick. This medicine may increase your risk to bruise or bleed. Call your doctor or health care professional if you notice any unusual bleeding. Be careful brushing and flossing your teeth or using a toothpick because you may get an infection or bleed more easily. If you have any dental work done, tell your dentist you are receiving this medicine. You may get drowsy or dizzy. Do not drive, use machinery, or do anything that needs mental alertness until you know how this medicine affects you. Do not become pregnant while taking this medicine or for 1 year after stopping it. Women should inform their doctor if they wish to become pregnant or think they might be pregnant. Men should not father a child while taking this medicine and for 4 months after stopping it. There is a potential for serious side  effects to an unborn child. Talk to your health care professional or pharmacist for more information. Do not breast-feed an infant while taking this medicine. This medicine may interfere with the ability to have a child. This medicine has caused ovarian failure in some women. This medicine has caused reduced sperm counts in some men. You should talk with your doctor or health care professional if you are concerned about your fertility. If you are going to have surgery, tell your doctor or health care professional that you have taken this medicine. What side effects may I notice from receiving this medicine? Side effects that you should report to your doctor or health care professional as soon as possible: -allergic reactions like skin rash, itching or hives, swelling of the face, lips, or tongue -low blood counts - this medicine may decrease the number of white blood cells, red blood cells and platelets. You may be at increased risk for infections and bleeding. -signs of infection - fever or chills, cough, sore throat, pain or difficulty passing urine -signs of decreased platelets or bleeding - bruising, pinpoint red spots on the skin, black, tarry stools, blood   in the urine -signs of decreased red blood cells - unusually weak or tired, fainting spells, lightheadedness -breathing problems -dark urine -dizziness -palpitations -swelling of the ankles, feet, hands -trouble passing urine or change in the amount of urine -weight gain -yellowing of the eyes or skin Side effects that usually do not require medical attention (report to your doctor or health care professional if they continue or are bothersome): -changes in nail or skin color -hair loss -missed menstrual periods -mouth sores -nausea, vomiting This list may not describe all possible side effects. Call your doctor for medical advice about side effects. You may report side effects to FDA at 1-800-FDA-1088. Where should I keep my  medicine? This drug is given in a hospital or clinic and will not be stored at home. NOTE: This sheet is a summary. It may not cover all possible information. If you have questions about this medicine, talk to your doctor, pharmacist, or health care provider.  2018 [employer]/Gold Standard (2012-03-23 16:22:58)  

## 2017-08-04 ENCOUNTER — Telehealth: Payer: Self-pay | Admitting: *Deleted
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
## 2017-08-04 NOTE — Telephone Encounter (Signed)
Called patient's daughter, Berenice Bouton, to inform her that patient's port placement appointment has been cancelled.  The plan is to to regroup and discuss plan on Tuesday with Dr. Mike Gip. Gave patient appointment time.

## 2017-08-07 ENCOUNTER — Emergency Department: Payer: Medicare Other

## 2017-08-07 ENCOUNTER — Encounter: Admission: RE | Payer: Self-pay | Source: Ambulatory Visit

## 2017-08-07 ENCOUNTER — Ambulatory Visit: Admission: RE | Admit: 2017-08-07 | Payer: Medicare Other | Source: Ambulatory Visit | Admitting: Vascular Surgery

## 2017-08-07 ENCOUNTER — Emergency Department
Admission: EM | Admit: 2017-08-07 | Discharge: 2017-08-08 | Disposition: A | Discharge status: Home / Self Care | Payer: Medicare Other | Attending: Emergency Medicine | Admitting: Emergency Medicine

## 2017-08-07 ENCOUNTER — Encounter: Payer: Self-pay | Admitting: Emergency Medicine

## 2017-08-07 ENCOUNTER — Other Ambulatory Visit: Payer: Self-pay

## 2017-08-07 DIAGNOSIS — Z79899 Other long term (current) drug therapy: Secondary | ICD-10-CM | POA: Diagnosis not present

## 2017-08-07 DIAGNOSIS — C8590 Non-Hodgkin lymphoma, unspecified, unspecified site: Secondary | ICD-10-CM

## 2017-08-07 DIAGNOSIS — I10 Essential (primary) hypertension: Secondary | ICD-10-CM | POA: Diagnosis not present

## 2017-08-07 DIAGNOSIS — G20 Parkinson's disease: Secondary | ICD-10-CM | POA: Diagnosis not present

## 2017-08-07 DIAGNOSIS — Z87891 Personal history of nicotine dependence: Secondary | ICD-10-CM | POA: Insufficient documentation

## 2017-08-07 DIAGNOSIS — K6289 Other specified diseases of anus and rectum: Secondary | ICD-10-CM | POA: Insufficient documentation

## 2017-08-07 LAB — URINALYSIS, COMPLETE (UACMP) WITH MICROSCOPIC
BACTERIA UA: NONE SEEN
Bilirubin Urine: NEGATIVE
Glucose, UA: NEGATIVE mg/dL
Hgb urine dipstick: NEGATIVE
Ketones, ur: NEGATIVE mg/dL
Leukocytes, UA: NEGATIVE
NITRITE: NEGATIVE
PROTEIN: NEGATIVE mg/dL
RBC / HPF: NONE SEEN RBC/hpf (ref 0–5)
Specific Gravity, Urine: 1.016 (ref 1.005–1.030)
pH: 5 (ref 5.0–8.0)

## 2017-08-07 LAB — CBC WITH DIFFERENTIAL/PLATELET
BASOS PCT: 1 %
Basophils Absolute: 0 10*3/uL (ref 0–0.1)
EOS ABS: 0.1 10*3/uL (ref 0–0.7)
Eosinophils Relative: 3 %
HCT: 33.6 % — ABNORMAL LOW (ref 35.0–47.0)
Hemoglobin: 11 g/dL — ABNORMAL LOW (ref 12.0–16.0)
Lymphocytes Relative: 24 %
Lymphs Abs: 1.2 10*3/uL (ref 1.0–3.6)
MCH: 28.1 pg (ref 26.0–34.0)
MCHC: 32.7 g/dL (ref 32.0–36.0)
MCV: 85.8 fL (ref 80.0–100.0)
MONO ABS: 0.8 10*3/uL (ref 0.2–0.9)
Monocytes Relative: 15 %
Neutro Abs: 2.9 10*3/uL (ref 1.4–6.5)
Neutrophils Relative %: 57 %
Platelets: 415 10*3/uL (ref 150–440)
RBC: 3.91 MIL/uL (ref 3.80–5.20)
RDW: 15.3 % — AB (ref 11.5–14.5)
WBC: 5 10*3/uL (ref 3.6–11.0)

## 2017-08-07 LAB — COMPREHENSIVE METABOLIC PANEL
ALT: 12 U/L — ABNORMAL LOW (ref 14–54)
AST: 25 U/L (ref 15–41)
Albumin: 3.1 g/dL — ABNORMAL LOW (ref 3.5–5.0)
Alkaline Phosphatase: 68 U/L (ref 38–126)
Anion gap: 11 (ref 5–15)
BUN: 22 mg/dL — AB (ref 6–20)
CHLORIDE: 100 mmol/L — AB (ref 101–111)
CO2: 19 mmol/L — AB (ref 22–32)
CREATININE: 1.1 mg/dL — AB (ref 0.44–1.00)
Calcium: 8.8 mg/dL — ABNORMAL LOW (ref 8.9–10.3)
GFR calc Af Amer: 50 mL/min — ABNORMAL LOW (ref >60)
GFR calc non Af Amer: 43 mL/min — ABNORMAL LOW (ref >60)
GLUCOSE: 108 mg/dL — AB (ref 65–99)
Potassium: 5.2 mmol/L — ABNORMAL HIGH (ref 3.5–5.1)
SODIUM: 130 mmol/L — AB (ref 135–145)
Total Bilirubin: 1.2 mg/dL (ref 0.3–1.2)
Total Protein: 8.2 g/dL — ABNORMAL HIGH (ref 6.5–8.1)

## 2017-08-07 LAB — TROPONIN I: Troponin I: <0.03 ng/mL (ref <0.03)

## 2017-08-07 SURGERY — PORTA CATH INSERTION
Anesthesia: Moderate Sedation

## 2017-08-07 MED ORDER — IOPAMIDOL (ISOVUE-300) INJECTION 61%
100.0000 mL | Freq: Once | INTRAVENOUS | Status: AC | PRN
  Administered 2017-08-07: 100 mL via INTRAVENOUS

## 2017-08-07 MED ORDER — FENTANYL CITRATE (PF) 100 MCG/2ML IJ SOLN
50.0000 ug | Freq: Once | INTRAMUSCULAR | Status: AC
  Administered 2017-08-07: 50 ug via INTRAVENOUS
  Filled 2017-08-07: qty 2

## 2017-08-07 MED ORDER — HYDROCODONE-ACETAMINOPHEN 5-325 MG PO TABS: 1.0000 | ORAL_TABLET | Freq: Four times a day (QID) | ORAL | 0 refills | Status: DC | PRN

## 2017-08-07 NOTE — ED Notes (Signed)
Pt given meal tray and drink and xtra warm blanket
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
## 2017-08-07 NOTE — ED Provider Notes (Signed)
[facility] Emergency Department Provider Note  ____________________________________________   First MD Initiated Contact with Patient 08/07/17 1426     (approximate)  I have reviewed the triage vital signs and the nursing notes.   HISTORY  Chief Complaint Rectal Pain   HPI Susan Villegas is a 84 y.o. female who comes to the emergency department with gradual onset slowly progressive now constant severe rectal pain.  She has a past medical history of rectal lymphoma and has not yet begun treatment.  She is brought to the emergency department by her daughter who is concerned that the patient's pain is not adequately controlled.  She also reports some mild abdominal pain.  Some nausea.  The pain is in her rectum and is nonradiating.  The pain seems to be worse with defecation.  Somewhat improved with rest.  Past Medical History:  Diagnosis Date  . Arthritis   . Hypertension   . Parkinson's disease [facility])     Patient Active Problem List   Diagnosis Date Noted  . Diffuse large B-cell lymphoma ([place]) 08/01/2017  . Elevated uric acid in blood 07/30/2017  . Goals of care, counseling/discussion 07/19/2017  . Sacral mass 06/29/2017    History reviewed. No pertinent surgical history.  Prior to Admission medications   Medication Sig Start Date End Date Taking? Authorizing Provider  allopurinol (ZYLOPRIM) 300 MG tablet Take 1 tablet (300 mg total) by mouth daily. 07/27/17  Yes Karen Kitchens, NP  donepezil (ARICEPT) 10 MG tablet Take 10 mg by mouth at bedtime.  07/05/17 07/05/18 Yes [provider]  DULoxetine (CYMBALTA) 60 MG capsule Take 1 capsule by mouth daily. 06/13/17  Yes [provider]  felodipine (PLENDIL) 5 MG 24 hr tablet Take 1 tablet by mouth 2 (two) times daily. 06/14/17  Yes [provider]  losartan (COZAAR) 25 MG tablet Take 1 tablet by mouth daily. 05/09/17  Yes [provider]  oxybutynin (DITROPAN) 5 MG  tablet Take 1 tablet by mouth 2 (two) times daily. 06/17/17  Yes [provider]  polyethylene glycol (MIRALAX / GLYCOLAX) packet Take 17 g by mouth daily. 06/27/17  Yes [provider]  potassium chloride SA (KLOR-CON M20) 20 MEQ tablet Take 1 tablet (20 mEq total) by mouth daily. 07/26/17  Yes Schuyler Amor, MD  SYNTHROID 150 MCG tablet Take 1 tablet by mouth daily. 06/27/17  Yes [provider]  HYDROcodone-acetaminophen (NORCO) 5-325 MG tablet Take 1 tablet by mouth every 6 (six) hours as needed for up to 15 doses for severe pain. 08/07/17   Darel Hong, MD  HYDROcodone-acetaminophen (NORCO/VICODIN) 5-325 MG tablet Take 1 tablet by mouth every 4 (four) hours as needed for moderate pain. Patient not taking: Reported on 08/07/2017 07/21/15   Lavonia Drafts, MD  lidocaine (LIDODERM) 5 % Place 1 patch onto the skin every 12 (twelve) hours. Remove & Discard patch within 12 hours or as directed by MD Patient not taking: Reported on 08/07/2017 12/29/16 12/29/17  Merlyn Lot, MD    Allergies Patient has no known allergies.  Family History  Problem Relation Age of Onset  . Cancer Sister     Social History Social History   Tobacco Use  . Smoking status: Former Smoker    Packs/day: 0.50    Years: 15.00    Pack years: 7.50    Last attempt to quit: 1999    Years since quitting: 20.2  . Smokeless tobacco: Never Used  Substance Use Topics  .  Alcohol use: No  . Drug use: No    Review of Systems Constitutional: No fever/chills Eyes: No visual changes. ENT: No sore throat. Cardiovascular: Denies chest pain. Respiratory: Denies shortness of breath. Gastrointestinal: Positive for abdominal pain.  Positive for nausea, no vomiting.  No diarrhea.  No constipation. Genitourinary: Negative for dysuria. Musculoskeletal: Negative for back pain. Skin: Negative for rash. Neurological: Negative for headaches, focal weakness or  numbness.   ____________________________________________   PHYSICAL EXAM:  VITAL SIGNS: ED Triage Vitals [08/07/17 1253]  Enc Vitals Group     BP 129/66     Pulse Rate 87     Resp 20     Temp 98.1 F (36.7 C)     Temp Source Oral     SpO2 95 %     Weight      Height      Head Circumference      Peak Flow      Pain Score      Pain Loc      Pain Edu?      Excl. in [place]?     Constitutional: Lying prone on the bed crying in pain appears quite uncomfortable Eyes: PERRL EOMI. Head: Atraumatic. Nose: No congestion/rhinnorhea. Mouth/Throat: No trismus Neck: No stridor.   Cardiovascular: Normal rate, regular rhythm. Grossly normal heart sounds.  Good peripheral circulation. Respiratory: Normal respiratory effort.  No retractions. Lungs CTAB and moving good air Gastrointestinal: Soft nontender no peritonitis exquisite discomfort with digital rectal examination Musculoskeletal: No lower extremity edema   Neurologic:  No gross focal neurologic deficits are appreciated. Skin:  Skin is warm, dry and intact. No rash noted. Psychiatric: Significant dementia   ____________________________________________   DIFFERENTIAL includes but not limited to  Bowel obstruction, volvulus, perforation, progression of cancer ____________________________________________   LABS (all labs ordered are listed, but only abnormal results are displayed)  Labs Reviewed  CBC WITH DIFFERENTIAL/PLATELET - Abnormal; Notable for the following components:      Result Value   Hemoglobin 11.0 (*)    HCT 33.6 (*)    RDW 15.3 (*)    All other components within normal limits  URINALYSIS, COMPLETE (UACMP) WITH MICROSCOPIC - Abnormal; Notable for the following components:   Color, Urine YELLOW (*)    APPearance HAZY (*)    Squamous Epithelial / LPF 0-5 (*)    All other components within normal limits  COMPREHENSIVE METABOLIC PANEL - Abnormal; Notable for the following components:   Sodium 130 (*)     Potassium 5.2 (*)    Chloride 100 (*)    CO2 19 (*)    Glucose, Bld 108 (*)    BUN 22 (*)    Creatinine, Ser 1.10 (*)    Calcium 8.8 (*)    Total Protein 8.2 (*)    Albumin 3.1 (*)    ALT 12 (*)    GFR calc non Af Amer 43 (*)    GFR calc Af Amer 50 (*)    All other components within normal limits  TROPONIN I    Lab work reviewed by me with no acute disease __________________________________________  EKG  ED ECG REPORT I, Darel Hong, the attending physician, personally viewed and interpreted this ECG.  Date: 08/07/2017 EKG Time:  Rate: 80 Rhythm: normal sinus rhythm QRS Axis: normal Intervals: normal ST/T Wave abnormalities: normal Narrative Interpretation: no evidence of acute ischemia  ____________________________________________  RADIOLOGY  CT abdomen pelvis reviewed by me with no evidence of obstruction but does show  progression of cancer ____________________________________________   PROCEDURES  Procedure(s) performed: no  Procedures  Critical Care performed: no  Observation: no ____________________________________________   INITIAL IMPRESSION / ASSESSMENT AND PLAN / ED COURSE  Pertinent labs & imaging results that were available during my care of the patient were reviewed by me and considered in my medical decision making (see chart for details).  The patient arrives extremely uncomfortable appearing with known distal malignancy.  Given the progression of her symptoms she requires a CT scan with IV contrast.  IV fentanyl for now.  Fortunately the patient's pain is currently adequately controlled.  The CT scan shows no obstruction and no evidence of infection but does show progression of disease distally which likely explains the pain.  I had a lengthy discussion with the patient and her daughter at bedside regarding the diagnosis and the importance of follow-up with her oncologist tomorrow as already scheduled.  The patient's daughter is concerned  that the patient has an unstable living environment as she lives with her son who she feels is unable to adequately care for the patient.  They already have a hospice referral in.  The patient's daughter does not feel comfortable having her go home tonight and will pick her up at 8 AM tomorrow for her 59 oncology appointment.  The patient will board in the emergency department overnight.      ____________________________________________   FINAL CLINICAL IMPRESSION(S) / ED DIAGNOSES  Final diagnoses:  Rectal pain  Lymphoma, unspecified body region, unspecified lymphoma type ([place])      NEW MEDICATIONS STARTED DURING THIS VISIT:  New Prescriptions   HYDROCODONE-ACETAMINOPHEN (NORCO) 5-325 MG TABLET    Take 1 tablet by mouth every 6 (six) hours as needed for up to 15 doses for severe pain.     Note:  This document was prepared using Dragon voice recognition software and may include unintentional dictation errors.     Darel Hong, MD 08/07/17 2041
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
## 2017-08-07 NOTE — Discharge Instructions (Signed)
Please keep your appointment for your oncologist tomorrow as scheduled and return to the emergency department sooner for any concerns.  It was a pleasure to take care of you today, and thank you for coming to our emergency department.  If you have any questions or concerns before leaving please ask the nurse to grab me and I'm more than happy to go through your aftercare instructions again.  If you were prescribed any opioid pain medication today such as Norco, Vicodin, Percocet, morphine, hydrocodone, or oxycodone please make sure you do not drive when you are taking this medication as it can alter your ability to drive safely.  If you have any concerns once you are home that you are not improving or are in fact getting worse before you can make it to your follow-up appointment, please do not hesitate to call 911 and come back for further evaluation.  Darel Hong, MD  Results for orders placed or performed during the hospital encounter of 08/07/17  CBC with Differential  Result Value Ref Range   WBC 5.0 3.6 - 11.0 K/uL   RBC 3.91 3.80 - 5.20 MIL/uL   Hemoglobin 11.0 (L) 12.0 - 16.0 g/dL   HCT 33.6 (L) 35.0 - 47.0 %   MCV 85.8 80.0 - 100.0 fL   MCH 28.1 26.0 - 34.0 pg   MCHC 32.7 32.0 - 36.0 g/dL   RDW 15.3 (H) 11.5 - 14.5 %   Platelets 415 150 - 440 K/uL   Neutrophils Relative % 57 %   Neutro Abs 2.9 1.4 - 6.5 K/uL   Lymphocytes Relative 24 %   Lymphs Abs 1.2 1.0 - 3.6 K/uL   Monocytes Relative 15 %   Monocytes Absolute 0.8 0.2 - 0.9 K/uL   Eosinophils Relative 3 %   Eosinophils Absolute 0.1 0 - 0.7 K/uL   Basophils Relative 1 %   Basophils Absolute 0.0 0 - 0.1 K/uL  Urinalysis, Complete w Microscopic  Result Value Ref Range   Color, Urine YELLOW (A) YELLOW   APPearance HAZY (A) CLEAR   Specific Gravity, Urine 1.016 1.005 - 1.030   pH 5.0 5.0 - 8.0   Glucose, UA NEGATIVE NEGATIVE mg/dL   Hgb urine dipstick NEGATIVE NEGATIVE   Bilirubin Urine NEGATIVE NEGATIVE   Ketones,  ur NEGATIVE NEGATIVE mg/dL   Protein, ur NEGATIVE NEGATIVE mg/dL   Nitrite NEGATIVE NEGATIVE   Leukocytes, UA NEGATIVE NEGATIVE   RBC / HPF NONE SEEN 0 - 5 RBC/hpf   WBC, UA 0-5 0 - 5 WBC/hpf   Bacteria, UA NONE SEEN NONE SEEN   Squamous Epithelial / LPF 0-5 (A) NONE SEEN   Mucus PRESENT    Hyaline Casts, UA PRESENT   Comprehensive metabolic panel  Result Value Ref Range   Sodium 130 (L) 135 - 145 mmol/L   Potassium 5.2 (H) 3.5 - 5.1 mmol/L   Chloride 100 (L) 101 - 111 mmol/L   CO2 19 (L) 22 - 32 mmol/L   Glucose, Bld 108 (H) 65 - 99 mg/dL   BUN 22 (H) 6 - 20 mg/dL   Creatinine, Ser 1.10 (H) 0.44 - 1.00 mg/dL   Calcium 8.8 (L) 8.9 - 10.3 mg/dL   Total Protein 8.2 (H) 6.5 - 8.1 g/dL   Albumin 3.1 (L) 3.5 - 5.0 g/dL   AST 25 15 - 41 U/L   ALT 12 (L) 14 - 54 U/L   Alkaline Phosphatase 68 38 - 126 U/L   Total Bilirubin 1.2 0.3 -  1.2 mg/dL   GFR calc non Af Amer 43 (L) >60 mL/min   GFR calc Af Amer 50 (L) >60 mL/min   Anion gap 11 5 - 15  Troponin I  Result Value Ref Range   Troponin I <0.03 <0.03 ng/mL   Dg Pelvis 1-2 Views  Result Date: 07/09/2017 CLINICAL DATA:  Pain. EXAM: PELVIS - 1-2 VIEW COMPARISON:  06/29/2017 FINDINGS: There is advanced degenerative disc disease within the lower lumbar spine. Both hips appear located. There is no acute fracture or dislocation identified. Mild bilateral hip osteoarthritis. No acute fracture or subluxation. IMPRESSION: 1. No acute findings. 2. Mild bilateral hip osteoarthritis and lumbar degenerative disc disease. Electronically Signed   By: Kerby Moors M.D.   On: 07/09/2017 18:59   Ct Abdomen Pelvis W Contrast  Result Date: 08/07/2017 CLINICAL DATA:  Rectal pain EXAM: CT ABDOMEN AND PELVIS WITH CONTRAST TECHNIQUE: Multidetector CT imaging of the abdomen and pelvis was performed using the standard protocol following bolus administration of intravenous contrast. CONTRAST:  189mL ISOVUE-300 IOPAMIDOL (ISOVUE-300) INJECTION 61% COMPARISON:   07/28/2017 PET-CT, 06/29/2017 CT of the abdomen and pelvis FINDINGS: Lower chest: Mild scarring is noted in the bases bilaterally. No focal infiltrate or sizable effusion is seen. Hepatobiliary: Gallbladder demonstrates some dependent gallstones. No wall thickening is seen. The liver shows a few scattered cystic lesions. Mild fullness of the biliary system is noted without definitive obstructing mass. This is stable from prior CT examination Pancreas: Unremarkable. No pancreatic ductal dilatation or surrounding inflammatory changes. Spleen: Normal in size without focal abnormality. Adrenals/Urinary Tract: Adrenal glands are within normal limits. Kidneys show no renal calculi or obstructive changes. Delayed images demonstrate normal excretion of contrast. The bladder is partially distended. Some wall thickening is seen although likely related to incomplete distension as no hypermetabolic activity was noted on recent PET-CT. Stomach/Bowel: Scattered diverticular changes noted. No evidence of diverticulitis is seen. The appendix is within normal limits. No small bowel abnormality is noted. Vascular/Lymphatic: Aortic atherosclerosis. No a single right iliac chain node is noted best seen on image number 59 of series 2. It measures 11 mm in short axis and corresponds to a hypermetabolic lymph node seen on prior PET-CT. This was erroneously reported as left on the prior PET-CT. Reproductive: Uterus and bilateral adnexa are unremarkable. Other: There is a large presacral soft tissue mass lesion similar to that seen on prior CT examination. It measures approximately 12.0 by 5.2 cm in greatest dimension. It has increased in the interval from the prior CT from 06/29/2017 an also somewhat from the recent PET-CT at which time it measured approximately 9.7 cm in greatest transverse dimension. Musculoskeletal: Bony structures show involvement within the sacrum particularly on the right similar to that seen on the prior PET-CT.  Additionally some soft tissue changes are noted posterior to the sacrum on the right also stable from the prior PET-CT examination. Degenerative changes of the lumbar spine are seen. Old healing rib fracture of the left eleventh rib posteriorly is seen stable from prior PET-CT. IMPRESSION: Findings consistent with the known history of lymphoma involving primarily the sacrum as well as the presacral and post sacral soft tissues. The presacral soft tissue component has increased in size when compared with the prior exam. This is likely the etiology of the patient's underlying discomfort. A adjacent right iliac lymph node is noted similar to that seen on prior PET-CT. This had erroneously been reported as a right iliac lymph node. Cholelithiasis without complicating factors. Biliary ductal  dilatation stable from the prior CT examination. No obstructing mass lesion is noted. Diverticulosis without diverticulitis. Electronically Signed   By: Inez Catalina M.D.   On: 08/07/2017 18:05   Nm Pet Image Initial (pi) Skull Base To Thigh  Result Date: 07/28/2017 CLINICAL DATA:  Subsequent treatment strategy for non-Hodgkin's lymphoma. B-cell lymphoma. EXAM: NUCLEAR MEDICINE PET SKULL BASE TO THIGH TECHNIQUE: 8.6 mCi F-18 FDG was injected intravenously. Full-ring PET imaging was performed from the skull base to thigh after the radiotracer. CT data was obtained and used for attenuation correction and anatomic localization. Fasting blood glucose: 103 mg/dl Mediastinal blood pool activity: SUV max 2.24 COMPARISON:  CT 06/29/2017 choose one FINDINGS: NECK: No hypermetabolic lymph nodes in the neck.  For Incidental CT findings: None subsequent CHEST: No hypermetabolic mediastinal or hilar nodes. No suspicious pulmonary nodules on the CT scan. Incidental CT findings: Mild basilar atelectasis. Coronary artery calcification and aortic atherosclerotic calcification. ABDOMEN/PELVIS: No hypermetabolic lymph nodes in the upper abdomen.  Spleen is normal. Single hypermetabolic LEFT proximal internal iliac lymph node measures 7 mm (image 190, series 3) with SUV max equal 10.9. No additional hypermetabolic lymph nodes in the abdomen pelvis. Incidental CT findings: Cholelithiasis.  Diverticulosis. SKELETON: Lytic lesion involves the LEFT and RIGHT sacral ala as well as a soft tissue component in the presacral space. The soft tissue component measures 9.6 by 4.7 cm. The entire entity is intensely hypermetabolic with SUV max equal 38.7. Incidental CT findings: There is a fractured rib posteriorly on the LEFT involving the eleventh rib (image 143, series 2). This has associated metabolic activity SUV max equal 4.7. IMPRESSION: 1. Aggressive lytic mass involving the entire sacrum as well soft tissue extension in the presacral space with intense metabolic activity consistent with lymphoma. 2. Single LEFT internal iliac metastatic lymph node is small but intensely hypermetabolic. 3. No evidence of metastatic disease outside the pelvis. Normal size spleen. 4. Metabolic activity associated posterior LEFT eleventh rib is favored benign posttraumatic activity. Electronically Signed   By: Suzy Bouchard M.D.   On: 07/28/2017 14:55   Ct Biopsy  Result Date: 07/19/2017 INDICATION: Posterior right sacral mass EXAM: CT-GUIDED BIOPSY POSTERIOR RIGHT SACRAL MASS MEDICATIONS: 1% lidocaine local ANESTHESIA/SEDATION: 1.0 mg IV Versed; 50 mcg IV Fentanyl Moderate Sedation Time:  14 minutes The patient was continuously monitored during the procedure by the interventional radiology nurse under my direct supervision. PROCEDURE: The procedure, risks, benefits, and alternatives were explained to the patient. Questions regarding the procedure were encouraged and answered. The patient understands and consents to the procedure. Previous imaging reviewed. Patient position prone. Noncontrast localization CT performed. Right posterior sacral soft tissue and osseous mass was  localized. This extends into the right sacral paraspinous musculature. Overlying skin marked. Under sterile conditions and local anesthesia, a 17 gauge 6.8 cm access needle was advanced from a posterior approach to the right sacral paraspinous component of the mass. Needle position confirmed with CT. 18 gauge core biopsies obtained and placed in formalin. Needle removed. Postprocedure imaging demonstrates no hemorrhage or hematoma. Patient tolerated the procedure well without complication. Vital sign monitoring by nursing staff during the procedure will continue as patient is in the special procedures unit for post procedure observation. FINDINGS: The images document guide needle placement within the posterior right sacral mass. Post biopsy images demonstrate no hemorrhage or hematoma. COMPLICATIONS: None immediate. IMPRESSION: Successful CT-guided core biopsy of the right posterior sacral mass. Electronically Signed   By: Jerilynn Mages.  Shick M.D.   On: 07/19/2017  15:17  ° ° ° °

## 2017-08-07 NOTE — ED Notes (Signed)
Pt states she has something stuck in her rectum. Pt denies any pain.

## 2017-08-07 NOTE — ED Notes (Signed)
Recollect green sent to lab 

## 2017-08-08 ENCOUNTER — Telehealth: Payer: Self-pay | Admitting: *Deleted

## 2017-08-08 ENCOUNTER — Encounter: Payer: Self-pay | Admitting: Urgent Care

## 2017-08-08 ENCOUNTER — Inpatient Hospital Stay (HOSPITAL_BASED_OUTPATIENT_CLINIC_OR_DEPARTMENT_OTHER): Payer: Medicare Other | Admitting: Hematology and Oncology

## 2017-08-08 ENCOUNTER — Emergency Department
Admission: EM | Admit: 2017-08-08 | Discharge: 2017-08-08 | Disposition: A | Discharge status: Home / Self Care | Payer: Medicare Other | Attending: Emergency Medicine | Admitting: Emergency Medicine

## 2017-08-08 ENCOUNTER — Other Ambulatory Visit: Payer: Self-pay

## 2017-08-08 ENCOUNTER — Inpatient Hospital Stay: Payer: Medicare Other

## 2017-08-08 VITALS — BP 153/84 | HR 99 | Temp 98.4°F

## 2017-08-08 DIAGNOSIS — E790 Hyperuricemia without signs of inflammatory arthritis and tophaceous disease: Secondary | ICD-10-CM

## 2017-08-08 DIAGNOSIS — C8330 Diffuse large B-cell lymphoma, unspecified site: Secondary | ICD-10-CM

## 2017-08-08 DIAGNOSIS — R410 Disorientation, unspecified: Secondary | ICD-10-CM | POA: Insufficient documentation

## 2017-08-08 DIAGNOSIS — Z7189 Other specified counseling: Secondary | ICD-10-CM

## 2017-08-08 DIAGNOSIS — Z5321 Procedure and treatment not carried out due to patient leaving prior to being seen by health care provider: Secondary | ICD-10-CM | POA: Insufficient documentation

## 2017-08-08 DIAGNOSIS — F0390 Unspecified dementia without behavioral disturbance: Secondary | ICD-10-CM | POA: Diagnosis not present

## 2017-08-08 DIAGNOSIS — G893 Neoplasm related pain (acute) (chronic): Secondary | ICD-10-CM | POA: Diagnosis not present

## 2017-08-08 HISTORY — DX: Non-Hodgkin lymphoma, unspecified, unspecified site: C85.90

## 2017-08-08 HISTORY — DX: Malignant (primary) neoplasm, unspecified: C80.1

## 2017-08-08 MED ORDER — ACETAMINOPHEN 325 MG PO TABS
ORAL_TABLET | ORAL | Status: AC
  Administered 2017-08-08: 650 mg via ORAL
  Filled 2017-08-08: qty 2

## 2017-08-08 MED ORDER — FENTANYL 12 MCG/HR TD PT72: 12.5000 ug | MEDICATED_PATCH | TRANSDERMAL | 0 refills | Status: DC

## 2017-08-08 MED ORDER — ACETAMINOPHEN 325 MG PO TABS
650.0000 mg | ORAL_TABLET | Freq: Once | ORAL | Status: AC
  Administered 2017-08-08: 650 mg via ORAL

## 2017-08-08 NOTE — Progress Notes (Signed)
Patent presented to clinic today after being in the ED all night. Patient had soiled her clothes and had to be cleaned up.  She denies any pain today.  She is accompanied by her daughter today. She presented to ED yesterday because of rectal pain.

## 2017-08-08 NOTE — ED Triage Notes (Signed)
Pt is unable to this Rn why she is here. Pt talking about cities in [place], keeps asking if we know where [place] is.   Pt does NOT have pants on, is wrapped with chucks and blankets. Pt denies wanting scrub pants.   Pt states her sister drove her here and is in lobby. Unable to find sister. Asked pt if this RN could call pt daughter [name] and she said [name] brought her here.  Denies pain.

## 2017-08-08 NOTE — ED Notes (Signed)
Daughter [name] stating that she doesn't want to wait in lobby again since they were here 8 hours yesterday, stated "I'll just her to [facility]". Informed her and pt that this RN can't keep pt here but that it is not advised to leave without seeing a doctor. [name] stated that they would go to lobby to wait. Pt in wheelchair, daughter [name] wheeled pt back out to lobby.
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
## 2017-08-08 NOTE — ED Notes (Signed)
Per Dr. Archie Balboa and Dr. Quentin Cornwall no need to re-do blood work since it was done yesterday.

## 2017-08-08 NOTE — Telephone Encounter (Signed)
[facility] called stating that the daughter has requested Palliative care.

## 2017-08-08 NOTE — ED Notes (Signed)
Patient's discharge and follow up information reviewed with patient by ED nursing staff and patient given the opportunity to ask questions pertaining to ED visit and discharge plan of care. Patient advised that should symptoms not continue to improve, resolve entirely, or should new symptoms develop then a follow up visit with their PCP or a return visit to the ED may be warranted. Patient verbalized consent and understanding of discharge plan of care including potential need for further evaluation. Patient discharged in stable condition per attending ED physician on duty.   Pt's daughter present at bedside to take pt home.

## 2017-08-09 ENCOUNTER — Telehealth: Payer: Self-pay | Admitting: *Deleted

## 2017-08-09 ENCOUNTER — Telehealth: Payer: Self-pay | Admitting: Emergency Medicine

## 2017-08-09 NOTE — Telephone Encounter (Signed)
Called patient due to lwot to inquire about condition and follow up plans. Son answered and states he is with the patient.  Says patient is okay.  I asked about daughter [name].  He says she stays in [place] and is not there.

## 2017-08-09 NOTE — Telephone Encounter (Signed)
  We need to see what Dr Melrose Nakayama said yesterday about her mental competency and who makes decisions for her.    At last visit, she wanted treatment which would preclude Hospice.  Maybe the daughter is unaware of that Hospice doses not allow chemotherapy.  M

## 2017-08-09 NOTE — Telephone Encounter (Signed)
Daughter called asking for Dr Humberto Seals to return her call to discuss options to keep her mother at home such as hospice or palliative care. Please return her call [phone]

## 2017-08-10 ENCOUNTER — Other Ambulatory Visit: Payer: Self-pay | Admitting: *Deleted

## 2017-08-10 DIAGNOSIS — C8330 Diffuse large B-cell lymphoma, unspecified site: Secondary | ICD-10-CM

## 2017-08-14 LAB — SURGICAL PATHOLOGY

## 2017-08-15 ENCOUNTER — Emergency Department
Admission: EM | Admit: 2017-08-15 | Discharge: 2017-08-15 | Disposition: A | Discharge status: Home / Self Care | Payer: Medicare Other | Attending: Emergency Medicine | Admitting: Emergency Medicine

## 2017-08-15 ENCOUNTER — Inpatient Hospital Stay: Payer: Medicare Other

## 2017-08-15 ENCOUNTER — Other Ambulatory Visit: Payer: Self-pay

## 2017-08-15 ENCOUNTER — Emergency Department: Payer: Medicare Other

## 2017-08-15 ENCOUNTER — Telehealth: Payer: Self-pay | Admitting: *Deleted

## 2017-08-15 ENCOUNTER — Encounter: Payer: Self-pay | Admitting: Urgent Care

## 2017-08-15 ENCOUNTER — Inpatient Hospital Stay: Payer: Medicare Other | Admitting: Hematology and Oncology

## 2017-08-15 ENCOUNTER — Encounter: Payer: Self-pay | Admitting: Hematology and Oncology

## 2017-08-15 DIAGNOSIS — C8590 Non-Hodgkin lymphoma, unspecified, unspecified site: Secondary | ICD-10-CM | POA: Diagnosis not present

## 2017-08-15 DIAGNOSIS — G893 Neoplasm related pain (acute) (chronic): Secondary | ICD-10-CM

## 2017-08-15 DIAGNOSIS — W010XXA Fall on same level from slipping, tripping and stumbling without subsequent striking against object, initial encounter: Secondary | ICD-10-CM | POA: Diagnosis not present

## 2017-08-15 DIAGNOSIS — I10 Essential (primary) hypertension: Secondary | ICD-10-CM | POA: Insufficient documentation

## 2017-08-15 DIAGNOSIS — G20 Parkinson's disease: Secondary | ICD-10-CM | POA: Diagnosis not present

## 2017-08-15 DIAGNOSIS — M25551 Pain in right hip: Secondary | ICD-10-CM | POA: Diagnosis present

## 2017-08-15 DIAGNOSIS — W19XXXA Unspecified fall, initial encounter: Secondary | ICD-10-CM

## 2017-08-15 DIAGNOSIS — Z87891 Personal history of nicotine dependence: Secondary | ICD-10-CM | POA: Insufficient documentation

## 2017-08-15 DIAGNOSIS — Y92009 Unspecified place in unspecified non-institutional (private) residence as the place of occurrence of the external cause: Secondary | ICD-10-CM

## 2017-08-15 LAB — COMPREHENSIVE METABOLIC PANEL
ALBUMIN: 2.8 g/dL — AB (ref 3.5–5.0)
ALK PHOS: 74 U/L (ref 38–126)
ALT: 10 U/L — ABNORMAL LOW (ref 14–54)
AST: 25 U/L (ref 15–41)
Anion gap: 10 (ref 5–15)
BILIRUBIN TOTAL: 0.6 mg/dL (ref 0.3–1.2)
BUN: 13 mg/dL (ref 6–20)
CALCIUM: 9 mg/dL (ref 8.9–10.3)
CO2: 22 mmol/L (ref 22–32)
Chloride: 100 mmol/L — ABNORMAL LOW (ref 101–111)
Creatinine, Ser: 0.99 mg/dL (ref 0.44–1.00)
GFR calc Af Amer: 57 mL/min — ABNORMAL LOW (ref >60)
GFR calc non Af Amer: 49 mL/min — ABNORMAL LOW (ref >60)
GLUCOSE: 124 mg/dL — AB (ref 65–99)
POTASSIUM: 3.4 mmol/L — AB (ref 3.5–5.1)
Sodium: 132 mmol/L — ABNORMAL LOW (ref 135–145)
TOTAL PROTEIN: 7.7 g/dL (ref 6.5–8.1)

## 2017-08-15 LAB — CBC WITH DIFFERENTIAL/PLATELET
BASOS ABS: 0 10*3/uL (ref 0–0.1)
BASOS PCT: 1 %
Eosinophils Absolute: 0.1 10*3/uL (ref 0–0.7)
Eosinophils Relative: 1 %
HEMATOCRIT: 34.3 % — AB (ref 35.0–47.0)
HEMOGLOBIN: 11.3 g/dL — AB (ref 12.0–16.0)
Lymphocytes Relative: 13 %
Lymphs Abs: 0.6 10*3/uL — ABNORMAL LOW (ref 1.0–3.6)
MCH: 27.7 pg (ref 26.0–34.0)
MCHC: 33 g/dL (ref 32.0–36.0)
MCV: 83.9 fL (ref 80.0–100.0)
Monocytes Absolute: 0.6 10*3/uL (ref 0.2–0.9)
Monocytes Relative: 11 %
NEUTROS ABS: 3.7 10*3/uL (ref 1.4–6.5)
NEUTROS PCT: 74 %
Platelets: 370 10*3/uL (ref 150–440)
RBC: 4.09 MIL/uL (ref 3.80–5.20)
RDW: 15.5 % — ABNORMAL HIGH (ref 11.5–14.5)
WBC: 5 10*3/uL (ref 3.6–11.0)

## 2017-08-15 LAB — TROPONIN I: Troponin I: <0.03 ng/mL (ref <0.03)

## 2017-08-15 MED ORDER — HYDROCODONE-ACETAMINOPHEN 5-325 MG PO TABS: 1.0000 | ORAL_TABLET | Freq: Two times a day (BID) | ORAL | 0 refills | Status: DC

## 2017-08-15 NOTE — ED Provider Notes (Signed)
[facility] Emergency Department Provider Note       Time seen: ----------------------------------------- 9:36 AM on 08/15/2017 -----------------------------------------   I have reviewed the triage vital signs and the nursing notes.  HISTORY   Chief Complaint Fall    HPI Susan Villegas is a 84 y.o. female with a history of arthritis, cancer, hypertension, lymphoma and Parkinson's disease who presents to the ED for a fall.  Family was reportedly trying to get her up to go to a doctor's appointment when she slid down and fell.  Patient is complaining of right hip and low back pain which is also the location of bone cancer that she currently has.  Pain is moderate, worse with movement.  Past Medical History:  Diagnosis Date  . Arthritis   . Cancer ([place])   . Hypertension   . Lymphoma ([place])   . Parkinson's disease [facility])     Patient Active Problem List   Diagnosis Date Noted  . Cancer related pain 08/08/2017  . Diffuse large B-cell lymphoma ([place]) 08/01/2017  . Elevated uric acid in blood 07/30/2017  . Goals of care, counseling/discussion 07/19/2017  . Sacral mass 06/29/2017    No past surgical history on file.  Allergies Patient has no known allergies.  Social History Social History   Tobacco Use  . Smoking status: Former Smoker    Packs/day: 0.50    Years: 15.00    Pack years: 7.50    Last attempt to quit: 1999    Years since quitting: 20.2  . Smokeless tobacco: Never Used  Substance Use Topics  . Alcohol use: No  . Drug use: No   Review of Systems Constitutional: Negative for fever. Cardiovascular: Negative for chest pain. Respiratory: Negative for shortness of breath. Gastrointestinal: Negative for abdominal pain, vomiting and diarrhea. Musculoskeletal: Positive for back pain and right hip pain Skin: Negative for rash. Neurological: Negative for headaches, positive for weakness  All systems negative/normal/unremarkable  except as stated in the HPI  ____________________________________________   PHYSICAL EXAM:  VITAL SIGNS: ED Triage Vitals [08/15/17 0930]  Enc Vitals Group     BP (!) 126/51     Pulse Rate 94     Resp 16     Temp 98.3 F (36.8 C)     Temp Source Oral     SpO2 94 %     Weight      Height      Head Circumference      Peak Flow      Pain Score      Pain Loc      Pain Edu?      Excl. in [place]?    Constitutional: Alert and oriented.  Lethargic, no distress Eyes: Conjunctivae are normal. Normal extraocular movements. ENT   Head: Normocephalic and atraumatic.   Nose: No congestion/rhinnorhea.   Mouth/Throat: Mucous membranes are moist.   Neck: No stridor. Cardiovascular: Normal rate, regular rhythm. No murmurs, rubs, or gallops. Respiratory: Normal respiratory effort without tachypnea nor retractions. Breath sounds are clear and equal bilaterally. No wheezes/rales/rhonchi. Gastrointestinal: Soft and nontender. Normal bowel sounds Musculoskeletal: Pain and tenderness to the low back and pain with range of motion as well as tenderness to the right hip. Neurologic:  Normal speech and language. No gross focal neurologic deficits are appreciated.  Skin:  Skin is warm, dry and intact. No rash noted. Psychiatric: Mood and affect are normal. Speech and behavior are normal.  ___________________________________________  ED COURSE:  As part of  my medical decision making, I reviewed the following data within the [place] History obtained from family if available, nursing notes, old chart and ekg, as well as notes from prior ED visits. Patient presented for fall, right hip and low back pain., we will assess with labs and imaging as indicated at this time.   Procedures ____________________________________________   LABS (pertinent positives/negatives)  Labs Reviewed  CBC WITH DIFFERENTIAL/PLATELET - Abnormal; Notable for the following components:       Result Value   Hemoglobin 11.3 (*)    HCT 34.3 (*)    RDW 15.5 (*)    Lymphs Abs 0.6 (*)    All other components within normal limits  COMPREHENSIVE METABOLIC PANEL - Abnormal; Notable for the following components:   Sodium 132 (*)    Potassium 3.4 (*)    Chloride 100 (*)    Glucose, Bld 124 (*)    Albumin 2.8 (*)    ALT 10 (*)    GFR calc non Af Amer 49 (*)    GFR calc Af Amer 57 (*)    All other components within normal limits  TROPONIN I  URINALYSIS, COMPLETE (UACMP) WITH MICROSCOPIC    RADIOLOGY Images were viewed by me  Right hip and lumbar spine x-rays IMPRESSION: No acute finding.  ____________________________________________  DIFFERENTIAL DIAGNOSIS   Fall, contusion, fracture, chronic pain, cancer related pain, dehydration  FINAL ASSESSMENT AND PLAN  Fall, right hip and low back pain   Plan: The patient had presented for fall at home with right hip and low back pain. Patient's labs were reassuring. Patient's imaging not reveal any acute process.  She is cleared for outpatient follow-up.   Laurence Aly, MD   Note: This note was generated in part or whole with voice recognition software. Voice recognition is usually quite accurate but there are transcription errors that can and very often do occur. I apologize for any typographical errors that were not detected and corrected.     Earleen Newport, MD 08/15/17 [phone]

## 2017-08-15 NOTE — ED Notes (Signed)
Patient transported to X-ray 

## 2017-08-15 NOTE — ED Triage Notes (Signed)
Pt to ED via EMS from home c/o fall this morning.  Per EMS patient was being helped by family to get up for doctor's appointment today when she slid from the bed to the ground and they couldn't get her up.  Denies LOC or hitting head.  Patient is alert to voice, oriented x4.  C/o lower back pain and right hip pain.

## 2017-08-15 NOTE — Progress Notes (Signed)
Official letter received from [facility]. Previously filed APS referral has been accepted for evaluation. The letter indicates that the evaluation may take 30-45 days to complete. Additional substantiating findings will be communicated at the completion of the APS/DSS evaluation.   Copy of letter will be sent to medical records for scanning into the patient's EMR.   Honor Loh, MSN, APRN, FNP-C, CEN Oncology/Hematology Nurse Practitioner  [place] 08/15/17,1:00 PM

## 2017-08-15 NOTE — Telephone Encounter (Signed)
Daughter called to report that patient was taken to ER by EMS when they were unable to get her up. Per ER note she slid off bed trying to get up and was unable to get up and she is complaining of hip and back pain. I cancelled her appts for this morning and asked [name] to please call us back to reschedule. She states she will do that and then asked that Dr Mike Gip Please sign orders for Palliative / Hospice care ASAP

## 2017-08-15 NOTE — Progress Notes (Deleted)
[facility] day:  08/15/2017   Chief Complaint: Susan Villegas is a 84 y.o. female with diffuse large B cell lymphoma who is seen for 1 week reassessment.  HPI: The patient was last seen in the medical oncology clinic on 08/08/2017.  At that time, she had boarded overnight in the ER after presenting with rectal pain.  Pain was controlled by Fentanyl.  Fentanyl 12.5 mcg/hr patch was prescribed.  She was to use Lortab 5/325 q 6 hours prn pain.  Her daughter was to maintain a pain diary for adjustment of her pain medications.  Disposition regarding mental competency, medical power of attorney, and code status was still to be determined.  [facility] Services was involved.  She met with Dr. Melrose Nakayama on 08/08/2017 after our appointment.  Note by Rinaldo Ratel, CMA, indicated the patient had reasoning capacity to make decisions.  The patient and family were to look into retirement home option.  Referral was made for Hospice.  I spoke with Dr. Melrose Nakayama.  He confirmed the patient's ability to make decisions for herself.  He was unaware that Hospcie did not allow chemotherapy.  During the interim,   Past Medical History:  Diagnosis Date  . Arthritis   . Cancer ([place])   . Hypertension   . Lymphoma ([place])   . Parkinson's disease ([place])     No past surgical history on file.  Family History  Problem Relation Age of Onset  . Cancer Sister     Social History:  reports that she quit smoking about 20 years ago. She has a 7.50 pack-year smoking history. She has never used smokeless tobacco. She reports that she does not drink alcohol or use drugs. Patient dips snuff. She is a former cigarette smoker. She quit 30 years ago.  Patient lives with her son [name]) in [place], [place]. Daughter [name]) lives in [place], [place]. The patient is accompanied by her daughter [name]) today.  Allergies: No Known Allergies  Current Medications: Current Outpatient  Medications  Medication Sig Dispense Refill  . allopurinol (ZYLOPRIM) 300 MG tablet Take 1 tablet (300 mg total) by mouth daily. 30 tablet 0  . donepezil (ARICEPT) 10 MG tablet Take 10 mg by mouth at bedtime.     . DULoxetine (CYMBALTA) 60 MG capsule Take 1 capsule by mouth daily.  0  . felodipine (PLENDIL) 5 MG 24 hr tablet Take 1 tablet by mouth 2 (two) times daily.  1  . fentaNYL (DURAGESIC - DOSED MCG/HR) 12 MCG/HR Place 1 patch (12.5 mcg total) onto the skin every 3 (three) days. 5 patch 0  . HYDROcodone-acetaminophen (NORCO) 5-325 MG tablet Take 1 tablet by mouth every 6 (six) hours as needed for up to 15 doses for severe pain. 15 tablet 0  . HYDROcodone-acetaminophen (NORCO/VICODIN) 5-325 MG tablet Take 1 tablet by mouth every 4 (four) hours as needed for moderate pain. (Patient not taking: Reported on 08/07/2017) 20 tablet 0  . lidocaine (LIDODERM) 5 % Place 1 patch onto the skin every 12 (twelve) hours. Remove & Discard patch within 12 hours or as directed by MD (Patient not taking: Reported on 08/07/2017) 10 patch 0  . losartan (COZAAR) 25 MG tablet Take 1 tablet by mouth daily.    Marland Kitchen oxybutynin (DITROPAN) 5 MG tablet Take 1 tablet by mouth 2 (two) times daily.  2  . polyethylene glycol (MIRALAX / GLYCOLAX) packet Take 17 g by mouth daily.    . potassium chloride  SA (KLOR-CON M20) 20 MEQ tablet Take 1 tablet (20 mEq total) by mouth daily. 7 tablet 0  . SYNTHROID 150 MCG tablet Take 1 tablet by mouth daily.     No current facility-administered medications for this visit.     Review of Systems:  GENERAL:  Feels "better".  Sedentary.  No fevers, sweats.  No new weight today.  PERFORMANCE STATUS (ECOG):  3. HEENT:  No visual changes, runny nose, sore throat, mouth sores or tenderness. Lungs: No shortness of breath or cough.  No hemoptysis. Cardiac:  No chest pain, palpitations, orthopnea, or PND. GI:  Soiled clothes.  No nausea, vomiting, diarrhea, melena or hematochezia.  No prior  colonoscopy. GU:  No urgency, frequency, dysuria, or hematuria.  Incontinence.  Recurrent UTIs. Musculoskeletal:  Chronic lower back pain.  Right hip pain.  Left knee issues.  No muscle tenderness. Extremities:  No pain or swelling. Skin:  No rashes or skin changes. Neuro:  Dementia.  No headache, numbness or weakness, balance or coordination issues. Endocrine:  No diabetes.  Thyroid disease on Synthroid.  No hot flashes or night sweats. Psych:  No mood changes, depression or anxiety. Pain:  No focal pain in clinic. Review of systems:  All other systems reviewed and found to be negative.  Physical Exam: There were no vitals taken for this visit. GENERAL:  Elderly woman sitting comfortably in a wheelchair in the exam room in no acute distress. MENTAL STATUS:  Alert and oriented to person. HEAD:  Wearing a cap.  Black curly hair.  Normocephalic, atraumatic, face symmetric, no Cushingoid features. EYES:  Glasses. Brown eyes.  Pupils equal round and reactive to light and accomodation.  No conjunctivitis or scleral icterus. ENT:  Oropharynx clear without lesion.  Tongue normal. Mucous membranes moist.  RESPIRATORY:  Clear to auscultation without rales, wheezes or rhonchi. CARDIOVASCULAR:  Regular rate and rhythm without murmur, rub or gallop. ABDOMEN:  Soft, non-tender, with active bowel sounds, and no hepatosplenomegaly.  No masses. BACK:  No pain to palpation lower back. SKIN:  No bruises.  No rashes, ulcers or lesions. EXTREMITIES: No edema, no skin discoloration or tenderness.  No palpable cords. LYMPH NODES: No palpable cervical, supraclavicular, axillary or inguinal adenopathy  NEUROLOGICAL: Memory poor.  Moves all 4 extremities. PSYCH:  Appropriate.  Engaging.  Answers questions appropriately.   No visits with results within 3 Day(s) from this visit.  Latest known visit with results is:  Admission on 08/07/2017, Discharged on 08/08/2017  Component Date Value Ref Range Status  . WBC  08/07/2017 5.0  3.6 - 11.0 K/uL Final  . RBC 08/07/2017 3.91  3.80 - 5.20 MIL/uL Final  . Hemoglobin 08/07/2017 11.0* 12.0 - 16.0 g/dL Final  . HCT 08/07/2017 33.6* 35.0 - 47.0 % Final  . MCV 08/07/2017 85.8  80.0 - 100.0 fL Final  . MCH 08/07/2017 28.1  26.0 - 34.0 pg Final  . MCHC 08/07/2017 32.7  32.0 - 36.0 g/dL Final  . RDW 08/07/2017 15.3* 11.5 - 14.5 % Final  . Platelets 08/07/2017 415  150 - 440 K/uL Final  . Neutrophils Relative % 08/07/2017 57  % Final  . Neutro Abs 08/07/2017 2.9  1.4 - 6.5 K/uL Final  . Lymphocytes Relative 08/07/2017 24  % Final  . Lymphs Abs 08/07/2017 1.2  1.0 - 3.6 K/uL Final  . Monocytes Relative 08/07/2017 15  % Final  . Monocytes Absolute 08/07/2017 0.8  0.2 - 0.9 K/uL Final  . Eosinophils Relative 08/07/2017 3  %  Final  . Eosinophils Absolute 08/07/2017 0.1  0 - 0.7 K/uL Final  . Basophils Relative 08/07/2017 1  % Final  . Basophils Absolute 08/07/2017 0.0  0 - 0.1 K/uL Final   Performed at [facility], [address] [place], [place] [address]  . Color, Urine 08/07/2017 YELLOW* YELLOW Final  . APPearance 08/07/2017 HAZY* CLEAR Final  . Specific Gravity, Urine 08/07/2017 1.016  1.005 - 1.030 Final  . pH 08/07/2017 5.0  5.0 - 8.0 Final  . Glucose, UA 08/07/2017 NEGATIVE  NEGATIVE mg/dL Final  . Hgb urine dipstick 08/07/2017 NEGATIVE  NEGATIVE Final  . Bilirubin Urine 08/07/2017 NEGATIVE  NEGATIVE Final  . Ketones, ur 08/07/2017 NEGATIVE  NEGATIVE mg/dL Final  . Protein, ur 08/07/2017 NEGATIVE  NEGATIVE mg/dL Final  . Nitrite 08/07/2017 NEGATIVE  NEGATIVE Final  . Leukocytes, UA 08/07/2017 NEGATIVE  NEGATIVE Final  . RBC / HPF 08/07/2017 NONE SEEN  0 - 5 RBC/hpf Final  . WBC, UA 08/07/2017 0-5  0 - 5 WBC/hpf Final  . Bacteria, UA 08/07/2017 NONE SEEN  NONE SEEN Final  . Squamous Epithelial / LPF 08/07/2017 0-5* NONE SEEN Final  . Mucus 08/07/2017 PRESENT   Final  . Hyaline Casts, UA 08/07/2017 PRESENT   Final   Performed at [facility], [address] [place], [place] [address]  . Sodium 08/07/2017 130* 135 - 145 mmol/L Final  . Potassium 08/07/2017 5.2* 3.5 - 5.1 mmol/L Final  . Chloride 08/07/2017 100* 101 - 111 mmol/L Final  . CO2 08/07/2017 19* 22 - 32 mmol/L Final  . Glucose, Bld 08/07/2017 108* 65 - 99 mg/dL Final  . BUN 08/07/2017 22* 6 - 20 mg/dL Final  . Creatinine, Ser 08/07/2017 1.10* 0.44 - 1.00 mg/dL Final  . Calcium 08/07/2017 8.8* 8.9 - 10.3 mg/dL Final  . Total Protein 08/07/2017 8.2* 6.5 - 8.1 g/dL Final  . Albumin 08/07/2017 3.1* 3.5 - 5.0 g/dL Final  . AST 08/07/2017 25  15 - 41 U/L Final  . ALT 08/07/2017 12* 14 - 54 U/L Final  . Alkaline Phosphatase 08/07/2017 68  38 - 126 U/L Final  . Total Bilirubin 08/07/2017 1.2  0.3 - 1.2 mg/dL Final  . GFR calc non Af Amer 08/07/2017 43* >60 mL/min Final  . GFR calc Af Amer 08/07/2017 50* >60 mL/min Final   Comment: (NOTE) The eGFR has been calculated using the CKD EPI equation. This calculation has not been validated in all clinical situations. eGFR's persistently <60 mL/min signify possible Chronic Kidney Disease.   Georgiann Hahn gap 08/07/2017 11  5 - 15 Final   Performed at [facility], [address] [place], [place] [address]  . Troponin I 08/07/2017 <0.03  <0.03 ng/mL Final   Performed at [facility], [address] [place], [place] [address]    Assessment:  Susan Villegas is a 84 y.o. female with a diffuse large B cell lymphoma s/p CT guided biopsy on 07/19/2017.  She presented with a sacral mass resulting in pain and constipation.    Pathology revealed a large B cell lymphoma.  CD20 was diffusely and  strongly immunoreactive. The immunohistochemical (IHC) profile was  compatible with diffuse large B-cell lymphoma, non-GCB type, with co-expression of BCL2 (>90% of neoplastic cells) and MYC (about 50%) by IHC. Ki67 index was high, about 90%. MUM 1 and BCL6 are immunoreactive while CD10 was negative. CD5 was  non-contributory.   Abdomen and pelvic CT on 06/29/2017 revealed a  6 x 9 cm mass in the right sacrum extending anterior and posterior to the sacrum into the soft tissues. This was consistent with neoplasm and could represent myeloma or metastatic disease.  She is on allopurinol.  Hepatitis B core antibody total, hepatitis B surface antigen, and hepatitis C antibody were negative on 07/27/2017.  Echo on 08/02/2017 revealed an EF of 60-65%.    She has cancer related pain.  She began Fentanyl 12.5 mcg/hr on 08/08/2017.  She uses hydrocodone 5/Tylenol 325 every 6 hours prn pain.  She has dementia and is followed by neurology.  She has no medical power of attorney.  Competency has not been determined.  Symptomatically, she denies pain in clinic, although she has recently been in the ER secondary to pain associated with her cancer. Patient is eating well.  Exam is stable.  Performance status is 3.  Plan: 1.  Labs today:  CBC with diff, BMP, uric acid. 2.  Discuss consult with Dr. Melrose Nakayama.   Discuss diagnosis of diffuse large B cell lymphoma.  She has stage IV based on bone/bone marrow involvement.  Reviewed no treatment/Hospice, radiation for palliation (pain), and systemic chemotherapy.  Chemotherapy options (mini-RCHOP, RCDOP, RCEPP, RGCVP) previously discussed.  Echo reveals EF adequate for an anthracycline (min-RCHOP).  Side effects of chemotherapy reviewed.  Discussed myelosuppression, nausea, vomiting, hair loss, fever/infections, weakness, and fatigue.  Reviewed she could have life threatening issues given her age. Discussed tumor is growing and causing pain.  Reviewed inability to move forward with treatment until disposition by Dr. Melrose Nakayama and social situation resolved.  2. Discuss need for follow-up with Dr. Melrose Nakayama, her neurologist, for mental competency, determination of medical power of attorney, and code status issues.  Message sent to Dr. Lannie Fields office for assessment ASAP.   3.  Discuss  pain management.  Pain improved with Fentanyl in ER.  Discuss starting low dose Fentanyl patch and maintaining a pain diary.  Patient to use hydrocodone 5/Tylenol 325 every 6 hours as needed (Rx provided in the ER).  Adjust pain medication as needed.  Discussed keeping pain medication in a secure place for patient's use only.  4.  Await pending assessment by [facility] (APS). 5.  RTC in 1 week for MD assessment, labs (CBC with diff, BMP, uric acid) and adjustment of pain medications.   Lequita Asal, MD  08/15/2017, 5:42 AM   I saw and evaluated the patient, participating in the key portions of the service and reviewing pertinent diagnostic studies and records.  I reviewed the nurse practitioner's note and agree with the findings and the plan.  The assessment and plan were discussed with the patient.  Additional diagnostic studies of *** are needed to clarify *** and would change the clinical management.  A few ***multiple questions were asked by the patient and answered.   Nolon Stalls, MD 08/15/2017,5:42 AM

## 2017-08-15 NOTE — Telephone Encounter (Signed)
  Please call her daughter.  I need to talk to Susan Villegas.  At last visit, she wanted chemotherapy.  Dr Melrose Nakayama stated that she was mentally competent to make her own decisions.    I don't think she understands that Hospice does not allow chemotherapy as that would be for a curative intent.  They would allow palliative radiation for pain management.  I would like to see her when she can come in to discuss this again.  M

## 2017-08-16 ENCOUNTER — Telehealth: Payer: Self-pay | Admitting: Urgent Care

## 2017-08-16 ENCOUNTER — Encounter: Payer: Self-pay | Admitting: Hematology and Oncology

## 2017-08-16 NOTE — Telephone Encounter (Signed)
Called returned to patient's daughter, [name], to discuss plan of care. [name] advising that her mother is declining overall. She states, "Ya'll see her when she is good. She is up in a wheelchair and looking good. We get her home and can't do nothing with her". Daughter reports increasing frequency of AMS, combativeness, and overall decline in functional ability. Patient reported to be in the floor x 3 as of late. Patient was in the ED yesterday after "sliding to the floor". EMS has been out to the home on several occassions to get the patient out of the floor. Daughter asking for palliative care orders.   Reviewed palliative vs. Hospice services. Patient cannot be covered under hospice services if she is pursing treatment. Daughter states, "I think she is too weak for treatment. I don't want her to have it". I reminded [name] that patient has been deemed competent by Dr. Melrose Nakayama to make her own health decisions at this time. The last time that we met with the patient, she indicated that she would like to proceed with treatment. I advised [name] that Dr. Mike Gip would like to speak with the patient again face to face. Appointment on 08/15/2017 was cancelled due to ED visit. Daughter was to call to reschedule. She has not done so yet. I asked her when they would be able to come in. Daughter states, "please make it happen soon. She is getting bad. I can't handle her. She can't walk. She cannot go to a nursing home though".   Spoke with scheduling. Patient to be worked into clinic this week for further discussions regarding treatment.   Honor Loh, MSN, APRN, FNP-C, CEN Oncology/Hematology Nurse Practitioner  [facility] 08/16/17, 4:59 PM

## 2017-08-17 NOTE — Telephone Encounter (Signed)
Honor Loh, NP called patient's daughter, Berenice Bouton and spoke with her regarding patient.  Patient will RTC on Friday, 08-18-17 to discuss plans.

## 2017-08-18 ENCOUNTER — Emergency Department
Admission: EM | Admit: 2017-08-18 | Discharge: 2017-08-18 | Disposition: A | Discharge status: Home / Self Care | Payer: Medicare Other | Attending: Emergency Medicine | Admitting: Emergency Medicine

## 2017-08-18 ENCOUNTER — Emergency Department: Payer: Medicare Other

## 2017-08-18 ENCOUNTER — Inpatient Hospital Stay (HOSPITAL_BASED_OUTPATIENT_CLINIC_OR_DEPARTMENT_OTHER): Payer: Medicare Other | Admitting: Hematology and Oncology

## 2017-08-18 ENCOUNTER — Other Ambulatory Visit: Payer: Self-pay

## 2017-08-18 VITALS — BP 124/81 | HR 99 | Temp 96.8°F | Resp 18 | Wt 155.0 lb

## 2017-08-18 DIAGNOSIS — F0390 Unspecified dementia without behavioral disturbance: Secondary | ICD-10-CM | POA: Diagnosis not present

## 2017-08-18 DIAGNOSIS — G893 Neoplasm related pain (acute) (chronic): Secondary | ICD-10-CM | POA: Diagnosis not present

## 2017-08-18 DIAGNOSIS — G20 Parkinson's disease: Secondary | ICD-10-CM | POA: Insufficient documentation

## 2017-08-18 DIAGNOSIS — R102 Pelvic and perineal pain: Secondary | ICD-10-CM | POA: Diagnosis present

## 2017-08-18 DIAGNOSIS — C8330 Diffuse large B-cell lymphoma, unspecified site: Secondary | ICD-10-CM | POA: Diagnosis not present

## 2017-08-18 DIAGNOSIS — Z638 Other specified problems related to primary support group: Secondary | ICD-10-CM

## 2017-08-18 DIAGNOSIS — Z639 Problem related to primary support group, unspecified: Secondary | ICD-10-CM

## 2017-08-18 DIAGNOSIS — I10 Essential (primary) hypertension: Secondary | ICD-10-CM | POA: Insufficient documentation

## 2017-08-18 DIAGNOSIS — Z87891 Personal history of nicotine dependence: Secondary | ICD-10-CM | POA: Insufficient documentation

## 2017-08-18 DIAGNOSIS — E790 Hyperuricemia without signs of inflammatory arthritis and tophaceous disease: Secondary | ICD-10-CM | POA: Diagnosis not present

## 2017-08-18 DIAGNOSIS — Z8572 Personal history of non-Hodgkin lymphomas: Secondary | ICD-10-CM | POA: Insufficient documentation

## 2017-08-18 DIAGNOSIS — Z79899 Other long term (current) drug therapy: Secondary | ICD-10-CM | POA: Insufficient documentation

## 2017-08-18 DIAGNOSIS — Z7189 Other specified counseling: Secondary | ICD-10-CM

## 2017-08-18 LAB — GLUCOSE, CAPILLARY: Glucose-Capillary: 101 mg/dL — ABNORMAL HIGH (ref 65–99)

## 2017-08-18 MED ORDER — HYDROCODONE-ACETAMINOPHEN 5-325 MG PO TABS: 0.5000 | ORAL_TABLET | Freq: Once | ORAL | Status: DC

## 2017-08-18 NOTE — Progress Notes (Signed)
Susan Pearline Cables, NP reported to PSN that the patient came to the cancer center today soiled with urine and stool.  PSN called DSS to share this information since there is an investigation going on from [employer].

## 2017-08-18 NOTE — ED Triage Notes (Signed)
Pt to the er for increased pain to the to coccyx this morning. Recent dx of bone cancer. Pt has opted out of chemo.

## 2017-08-18 NOTE — ED Provider Notes (Signed)
[facility] Emergency Department Provider Note   ____________________________________________   First MD Initiated Contact with Patient 08/18/17 0703     (approximate)  I have reviewed the triage vital signs and the nursing notes.   HISTORY  Chief Complaint Tailbone Pain    HPI Susan Villegas is a 84 y.o. female with known cancer, affecting the pelvic tailbone region.  Hypertension and Parkinson's disease.  Patient presents today reporting ongoing pain in her lower pelvis.  She reports that she is been told is from cancer pain.  Evidently did have a fall a couple of days ago, was evaluated in the ER for that.  She also reports she was previously evaluated in the emergency room for pain around her rectum.  Denies nausea or vomiting.  No abdominal pain.  Does not feel constipated.  Denies headache.  No neck pain no chest pain no trouble breathing.  Patient reports when she gets up and moves she has notable pain in her lower pelvis.  No pain in the knees or lower legs.  Denies pain in the hips.  Past Medical History:  Diagnosis Date  . Arthritis   . Cancer ([place])   . Hypertension   . Lymphoma ([place])   . Parkinson's disease [facility])     Patient Active Problem List   Diagnosis Date Noted  . Cancer related pain 08/08/2017  . Diffuse large B-cell lymphoma ([place]) 08/01/2017  . Elevated uric acid in blood 07/30/2017  . Goals of care, counseling/discussion 07/19/2017  . Sacral mass 06/29/2017    History reviewed. No pertinent surgical history.  Prior to Admission medications   Medication Sig Start Date End Date Taking? Authorizing Provider  allopurinol (ZYLOPRIM) 300 MG tablet Take 1 tablet (300 mg total) by mouth daily. 07/27/17  Yes Karen Kitchens, NP  donepezil (ARICEPT) 10 MG tablet Take 10 mg by mouth at bedtime.  07/05/17 07/05/18 Yes [provider]  DULoxetine (CYMBALTA) 60 MG capsule Take 1 capsule by mouth daily. 06/13/17  Yes [provider]  felodipine (PLENDIL) 5 MG 24 hr tablet Take 1 tablet by mouth 2 (two) times daily. 06/14/17  Yes [provider]  fentaNYL (DURAGESIC - DOSED MCG/HR) 12 MCG/HR Place 1 patch (12.5 mcg total) onto the skin every 3 (three) days. 08/08/17  Yes Karen Kitchens, NP  HYDROcodone-acetaminophen (NORCO/VICODIN) 5-325 MG tablet Take 1 tablet by mouth 2 (two) times daily. 08/15/17  Yes Earleen Newport, MD  losartan (COZAAR) 25 MG tablet Take 1 tablet by mouth daily. 05/09/17  Yes [provider]  oxybutynin (DITROPAN) 5 MG tablet Take 1 tablet by mouth 2 (two) times daily. 06/17/17  Yes [provider]  polyethylene glycol (MIRALAX / GLYCOLAX) packet Take 17 g by mouth daily as needed.  06/27/17  Yes [provider]  SYNTHROID 150 MCG tablet Take 1 tablet by mouth daily. 06/27/17  Yes [provider]  HYDROcodone-acetaminophen (NORCO) 5-325 MG tablet Take 1 tablet by mouth every 6 (six) hours as needed for up to 15 doses for severe pain. Patient not taking: Reported on 08/18/2017 08/07/17   Darel Hong, MD  HYDROcodone-acetaminophen (NORCO/VICODIN) 5-325 MG tablet Take 1 tablet by mouth every 4 (four) hours as needed for moderate pain. Patient not taking: Reported on 08/07/2017 07/21/15   Lavonia Drafts, MD  lidocaine (LIDODERM) 5 % Place 1 patch onto the skin every 12 (twelve) hours. Remove & Discard patch within 12 hours or as directed by MD Patient  not taking: Reported on 08/07/2017 12/29/16 12/29/17  Merlyn Lot, MD  potassium chloride SA (KLOR-CON M20) 20 MEQ tablet Take 1 tablet (20 mEq total) by mouth daily. Patient not taking: Reported on 08/15/2017 07/26/17   Schuyler Amor, MD    Allergies Patient has no known allergies.  Family History  Problem Relation Age of Onset  . Cancer Sister     Social History Social History   Tobacco Use  . Smoking status: Former Smoker    Packs/day: 0.50    Years: 15.00    Pack years: 7.50    Last  attempt to quit: 1999    Years since quitting: 20.2  . Smokeless tobacco: Never Used  Substance Use Topics  . Alcohol use: No  . Drug use: No    Review of Systems Constitutional: No fever/chills Eyes: No visual changes. ENT: No sore throat. Cardiovascular: Denies chest pain. Respiratory: Denies shortness of breath. Gastrointestinal: No abdominal pain.  No nausea, no vomiting.   Genitourinary: Negative for dysuria.  Reports pain in the lower pelvis around the rectum. Musculoskeletal: Negative for back pain.  Reports pain around the rectum. Skin: Negative for rash. Neurological: Negative for headaches, focal weakness or numbness.    ____________________________________________   PHYSICAL EXAM:  VITAL SIGNS: ED Triage Vitals  Enc Vitals Group     BP 08/18/17 0628 (!) 144/71     Pulse Rate 08/18/17 0628 97     Resp 08/18/17 0628 18     Temp 08/18/17 0628 98 F (36.7 C)     Temp Source 08/18/17 0628 Oral     SpO2 08/18/17 0628 97 %     Weight 08/18/17 0627 170 lb (77.1 kg)     Height 08/18/17 0627 5\' 3"  (1.6 m)     Head Circumference --      Peak Flow --      Pain Score 08/18/17 0627 9     Pain Loc --      Pain Edu? --      Excl. in [place]? --     Constitutional: Alert and oriented to self and place. Well appearing and in no acute distress. Eyes: Conjunctivae are normal. Head: Atraumatic. Nose: No congestion/rhinnorhea. Mouth/Throat: Mucous membranes are moist. Neck: No stridor.   Cardiovascular: Normal rate, regular rhythm. Grossly normal heart sounds.  Good peripheral circulation. Respiratory: Normal respiratory effort.  No retractions. Lungs CTAB. Gastrointestinal: Soft and nontender. No distention. Perirectal: Perineum observed with nurse, Sarah, and examined and there is tenderness over the sacrum but no overlying lesion, no erythema no evidence of ulcer formation.  There are no hemorrhoids.  No fecal impaction. Musculoskeletal:   Moves upper extremities equally  without distress or discomfort.  Lower Extremities  No edema. Normal DP/PT pulses bilateral with good cap refill.  Normal neuro-motor function lower extremities bilateral.  RIGHT Right lower extremity demonstrates normal strength, good use of all muscles. No edema bruising or contusions of the right hip, right knee, right ankle. Full range of motion of the right lower extremity though she was reports some pain in the right lower pelvic region and perirectal region with motion, and also some slight pain over the right hip joint without any deformity or shortening. No pain on axial loading. No evidence of trauma.  LEFT Left lower extremity demonstrates normal strength, good use of all muscles. No edema bruising or contusions of the hip,  knee, ankle. Full range of motion of the left lower extremity without pain. No pain on  axial loading. No evidence of trauma.   Neurologic:  Normal speech and language. No gross focal neurologic deficits are appreciated.  Skin:  Skin is warm, dry and intact. No rash noted. Psychiatric: Mood and affect are normal. Speech and behavior are normal.  ____________________________________________   LABS (all labs ordered are listed, but only abnormal results are displayed)  Labs Reviewed  GLUCOSE, CAPILLARY - Abnormal; Notable for the following components:      Result Value   Glucose-Capillary 101 (*)    All other components within normal limits  CBG MONITORING, ED   ____________________________________________  EKG  Reviewed and her by me at 7:15 AM Heart rate 90 QRS 90 QTC 500 Normal sinus rhythm, no evidence of ischemia ____________________________________________  RADIOLOGY  X-rays performed today reviewed by me, no acute findings to noted.  Known lymphoma. ____________________________________________   PROCEDURES  Procedure(s) performed: None  Procedures  Critical Care performed:  No  ____________________________________________   INITIAL IMPRESSION / ASSESSMENT AND PLAN / ED COURSE  Pertinent labs & imaging results that were available during my care of the patient were reviewed by me and considered in my medical decision making (see chart for details).  Patient presents for evaluation of perirectal pain.  Does not appear to report having had a fall or any trauma today, she reports pain in an area that is been progressively increasing for some time now.  It appears that she has had extensive workup and also previous ER visits for the same including a recent CT scan this month and imaging studies after a fall.  No acute neurologic cardiac or pulmonary symptoms.  Normal vital signs, afebrile.  No report of any systemic symptoms.  Appears likely consistent with increasing discomfort and pain secondary to her known cancer, also possibly some pain today as she had not taken her hydrocodone this morning.  Given the patient's age I will check a blood sugar and EKG, also obtain an x-ray of the right hip and pelvis to evaluate for any gross injury that could have occurred in the interim, but my overall suspicion for an acute injury is low and I suspect her pain is due to her increasing cancer burden.  ----------------------------------------- 7:24 AM on 08/18/2017 -----------------------------------------  Case discussed with patient's daughter, [name].  She reports is been an ongoing issue for some time and that this pain is known to be from her cancer which she is suspicious may be progressing.  She is currently receiving pain medication through her oncologist, she also taking Vicodin but did not have her dose at this morning.  The patient has notable pain when she tries to ambulate in her lower pelvic region.  Patient's daughter reports that they have an appointment today at about 11:00 here at the cancer center for her, part of which is to address her pain control and further  treatment plans.    Return precautions and treatment recommendations and follow-up discussed with the patient and her daughter who is now bedside, both are in agreeable with the plan.  Patient's next follow-up will be today with oncology as already scheduled.  She currently reports no pain.  Did not require hydrocodone.   ____________________________________________   FINAL CLINICAL IMPRESSION(S) / ED DIAGNOSES  Final diagnoses:  Pelvic pain      NEW MEDICATIONS STARTED DURING THIS VISIT:  New Prescriptions   No medications on file     Note:  This document was prepared using Dragon voice recognition software and may include unintentional dictation  errors.     Delman Kitten, MD 08/18/17 [phone]

## 2017-08-18 NOTE — Progress Notes (Signed)
Patient presented to clinic today, again soiled with urine and stool.  Patient cleaned up and a gown put on her since her clothes were wet from her waist down.  She states she has been in the ED all night.  Patent here with daughter and son to discuss plan.  Family members are in disagreement with whether or not patient should have chemo treatment.  Per Dr. Melrose Nakayama, patient is capable of making her own decisions.  Patient here today to determine treatment versus palliative care.
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
## 2017-08-18 NOTE — ED Notes (Signed)
Pt to the er for coccyx pain. Pt had a fentanyl patch 57mcg on. Pt reports pain waking her up at 11 [facility] Pt lives with son and daughter. Pt has hx of dementia. Pt has new dx of bone cancer and has decided against chemo.

## 2017-08-19 ENCOUNTER — Encounter: Payer: Self-pay | Admitting: Hematology and Oncology

## 2017-08-21 ENCOUNTER — Other Ambulatory Visit: Payer: Self-pay | Admitting: *Deleted

## 2017-08-21 MED ORDER — FENTANYL 12 MCG/HR TD PT72: 12.5000 ug | MEDICATED_PATCH | TRANSDERMAL | 0 refills | Status: AC

## 2017-08-21 MED ORDER — HYDROCODONE-ACETAMINOPHEN 5-325 MG PO TABS: 1.0000 | ORAL_TABLET | Freq: Four times a day (QID) | ORAL | 0 refills | Status: AC | PRN

## 2017-08-21 NOTE — Telephone Encounter (Signed)
Given a current oncological diagnosis, this patient has the potential to experience significant cancer related pain. Pain reported to be in pain by hospice nurse. Benefits versus risks associated with continued therapy considered. Will continue pain management with opioids as previously prescribed. Patient educated that medications should not be bitten, chewed, or crushed. Additionally, safety precautions reviewed. Patient's daughter verbalized understanding that medications should not be sold or shared, taken with alcohol, or used while driving.   Refill Rx sent in for: 1. Norco 5/325 mg PO q6h PRN (Disp #40) 2. Duragesic 12.5 mcg/hr patch (Disp #5)  Honor Loh, MSN, APRN, FNP-C, CEN Oncology/Hematology Nurse Practitioner  [facility] 08/21/17, 4:52 PM

## 2017-08-21 NOTE — Telephone Encounter (Signed)
Patient uncomfortable and her latest Norco prescription id written to use only twice a day. Hospice asking for order to give every 4 hours as needed as she is having increased pain. Needs refill of her Fentanyl 12  Mcg also. Please advise

## 2017-09-20 DEATH — deceased

## 2018-07-15 IMAGING — CR DG LUMBAR SPINE 2-3V
3 series · 3 of 3 positions shown · non-contrast
Comparison: CT 08/07/2017.

CLINICAL DATA: Fall.  Right hip and low back pain.

EXAM:
LUMBAR SPINE - 2-3 VIEW

[l-spine ap]
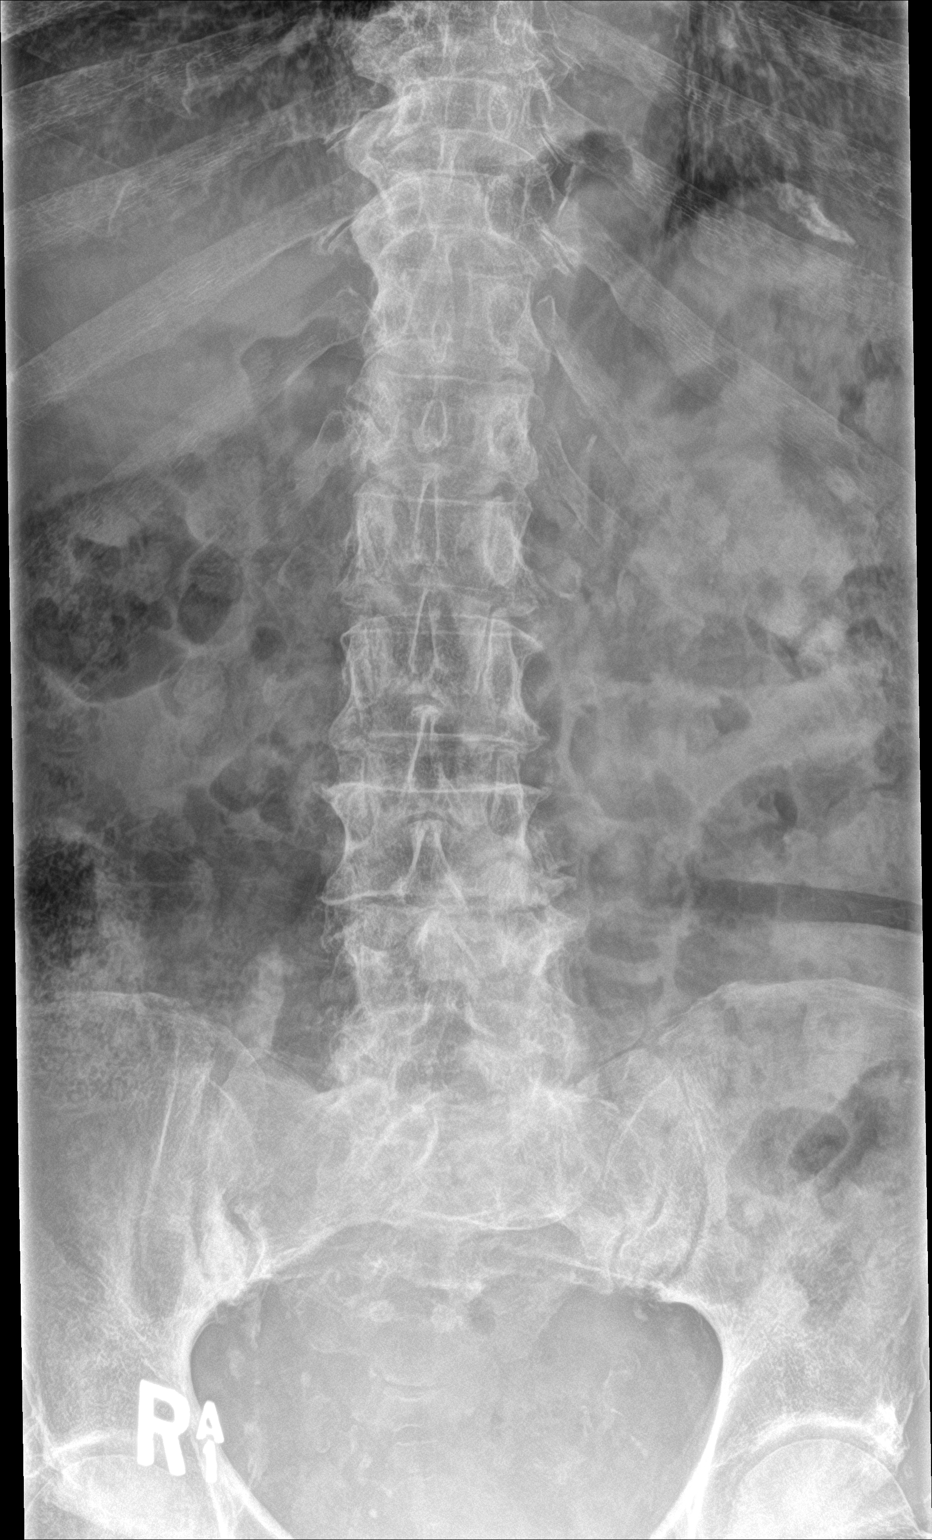

[l-spine lat]
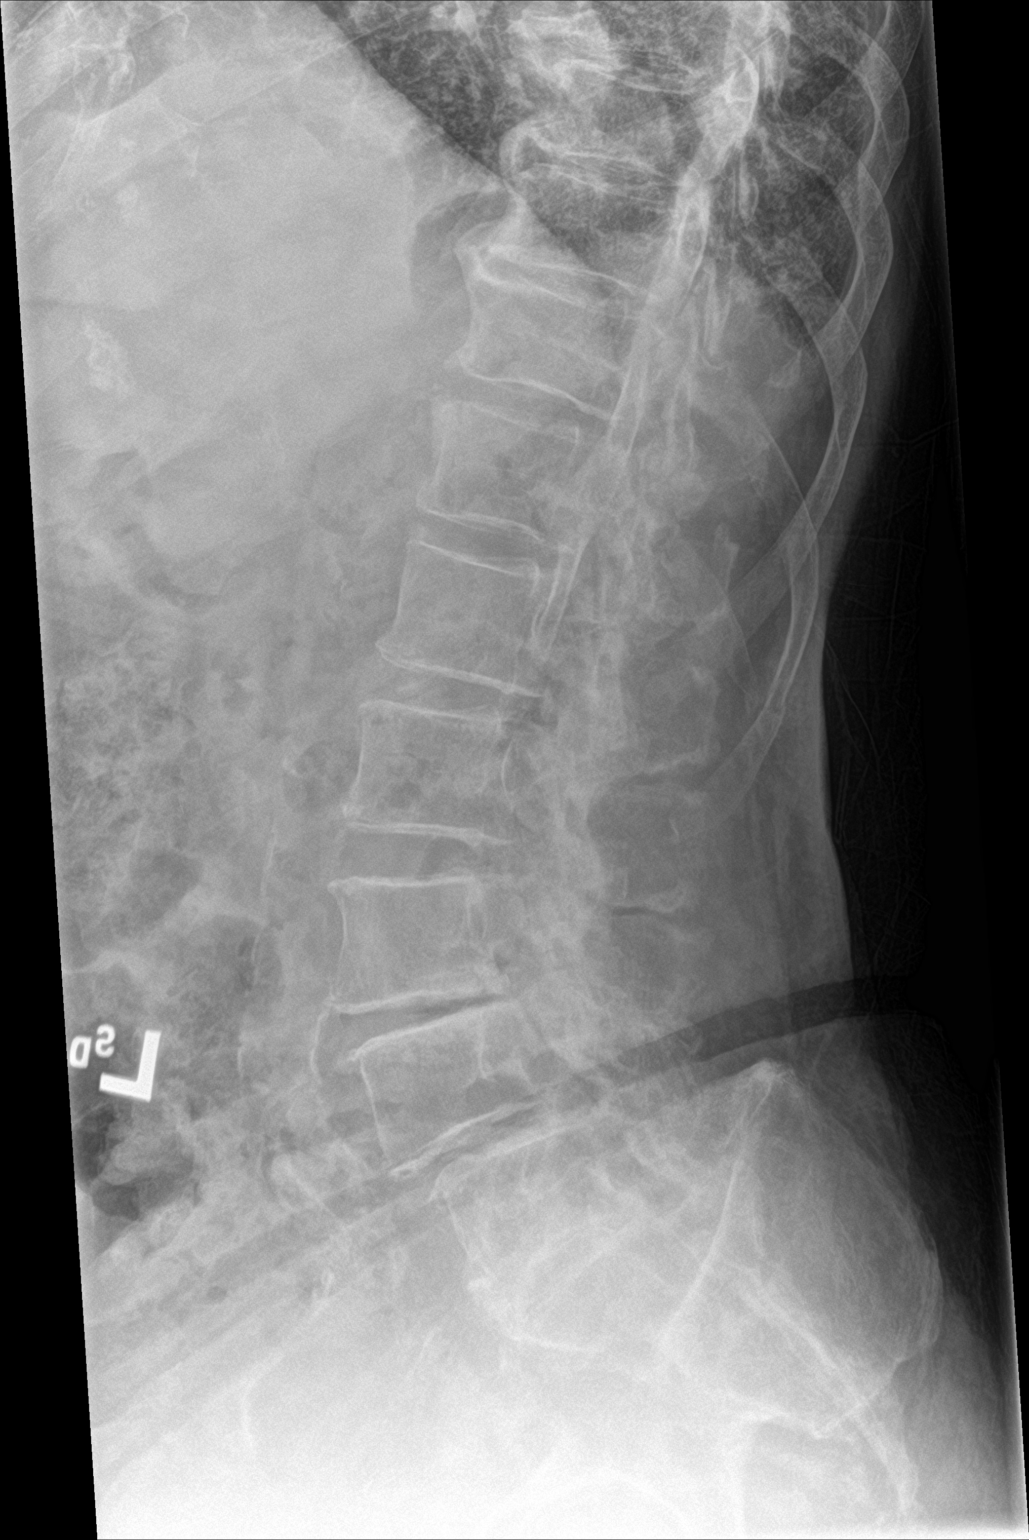

[l-spine spot]
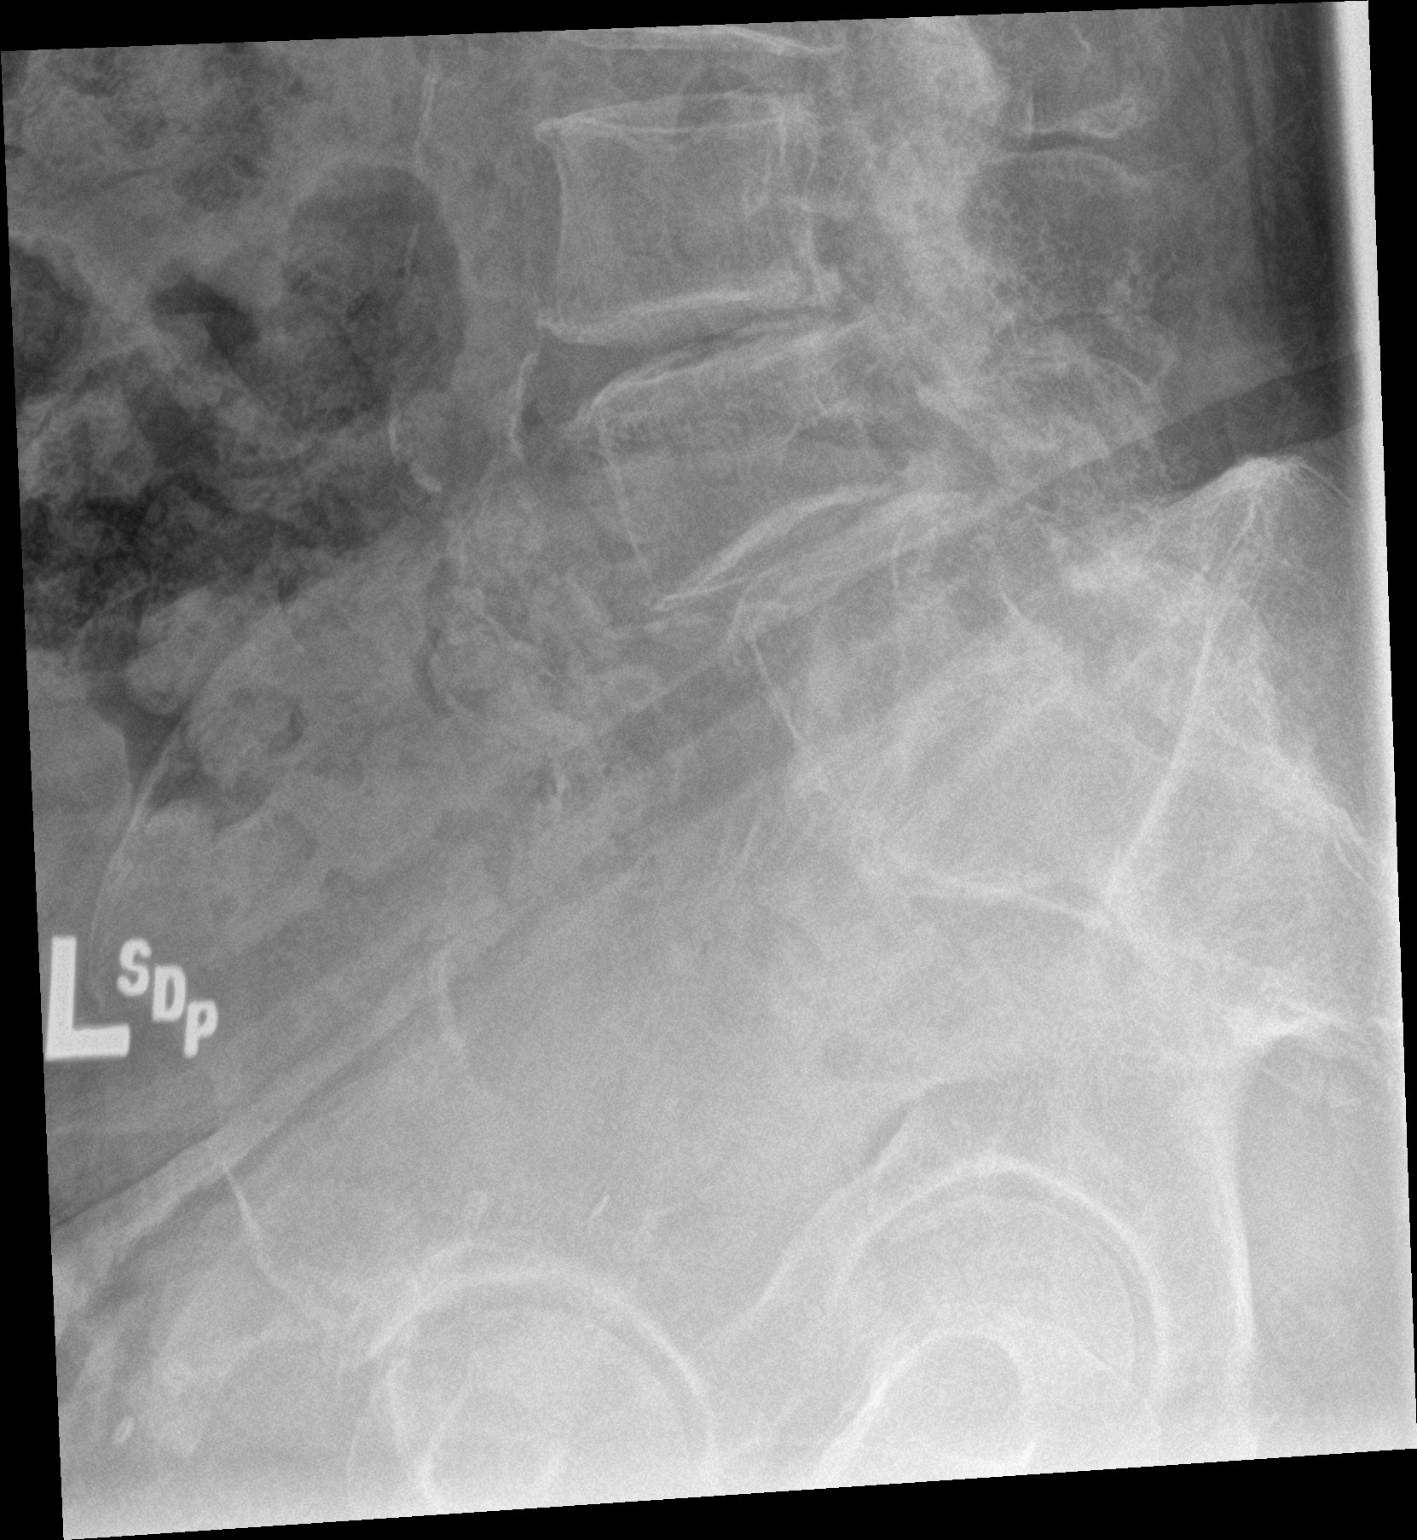

[3 of 3 positions shown; findings below may reference images not displayed]

FINDINGS: Mild lumbar spine scoliosis concave left. Diffuse osteopenia and
multilevel degenerative change. 8 mm anterolisthesis L4 on L5 again
noted. Degenerative changes both SI joints and hips. No acute bony
abnormality. Reference is made to prior CT report for discussion of
bony changes involving the sacrum. Aortic atherosclerotic vascular
disease.
IMPRESSION: 1. Mild lumbar spine scoliosis concave left. Diffuse osteopenia
multilevel degenerative change. 8 mm anterolisthesis L4-L5 again
noted. Degenerative changes both SI joints and hips. No acute bony
abnormality. No evidence of lumbar fracture.

2. Reference is made to prior CT report for discussion of bony
changes involving the sacrum.

3.  Aortic atherosclerotic vascular disease.

## 2018-07-18 IMAGING — CR DG HIP (WITH OR WITHOUT PELVIS) 2-3V*R*
3 series · 3 of 3 positions shown · non-contrast
Comparison: 08/15/2017 and CT 08/07/2017 as well as PET-CT
07/28/2017

CLINICAL DATA: Right pelvic pain 2-3 days. No injury. Known
lymphoma with spread to sacrum.

EXAM:
DG HIP (WITH OR WITHOUT PELVIS) 2-3V RIGHT

[pelvis ap]
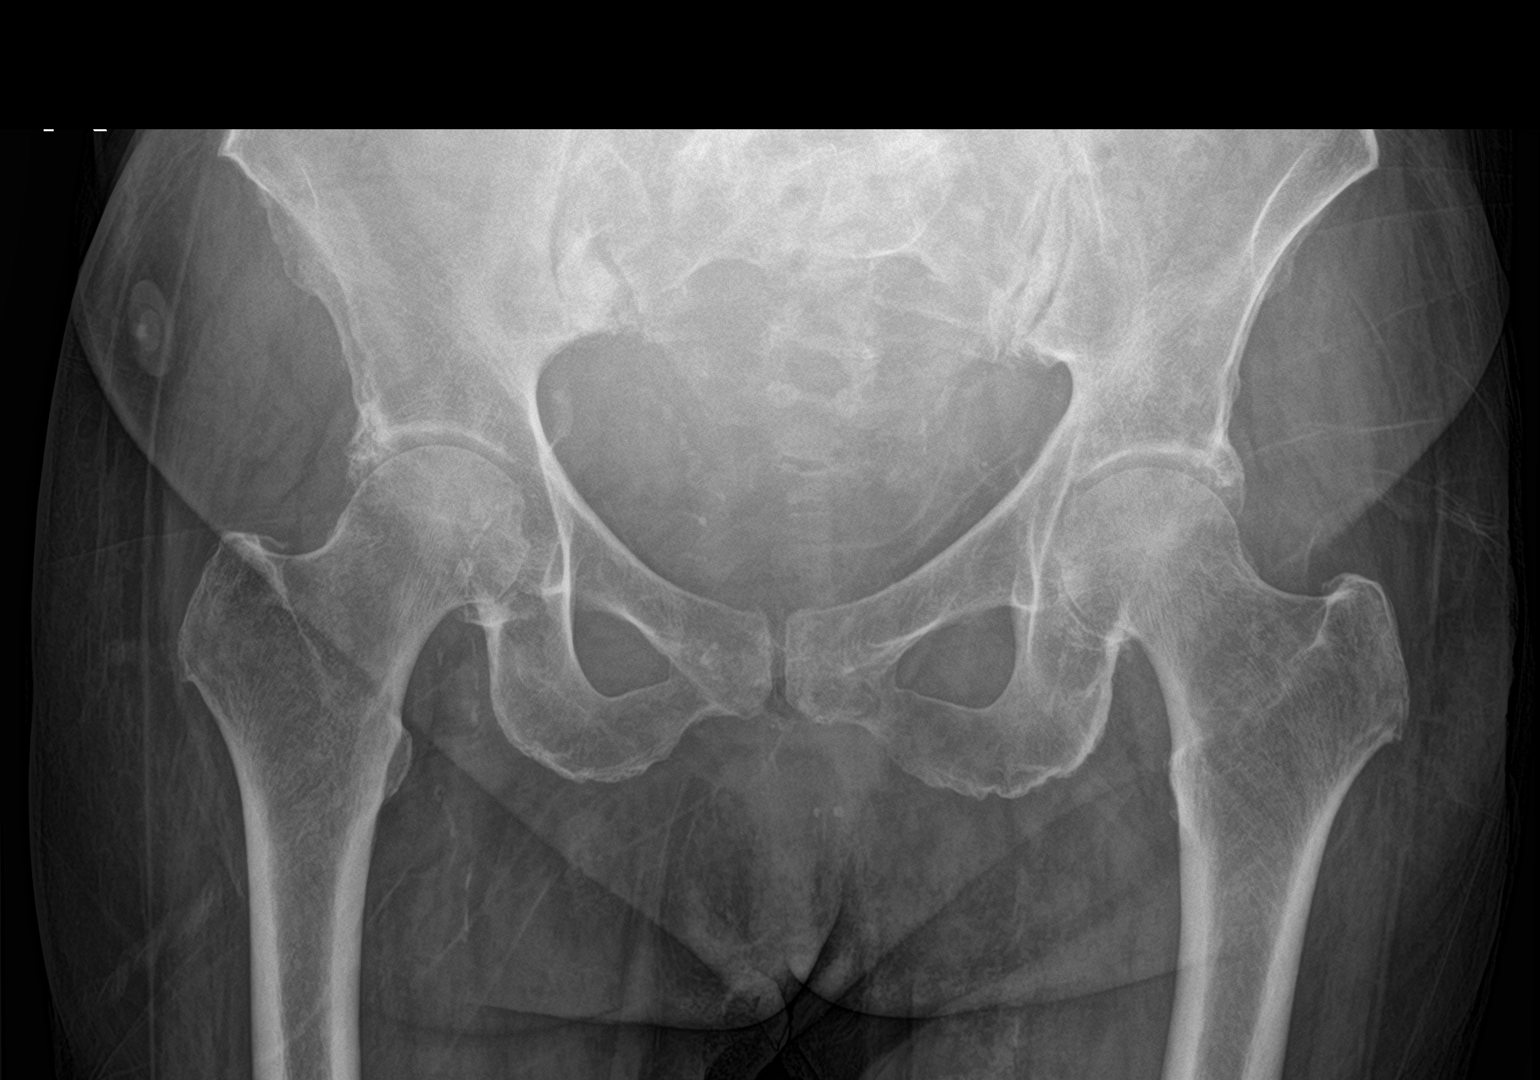

[hip ap]
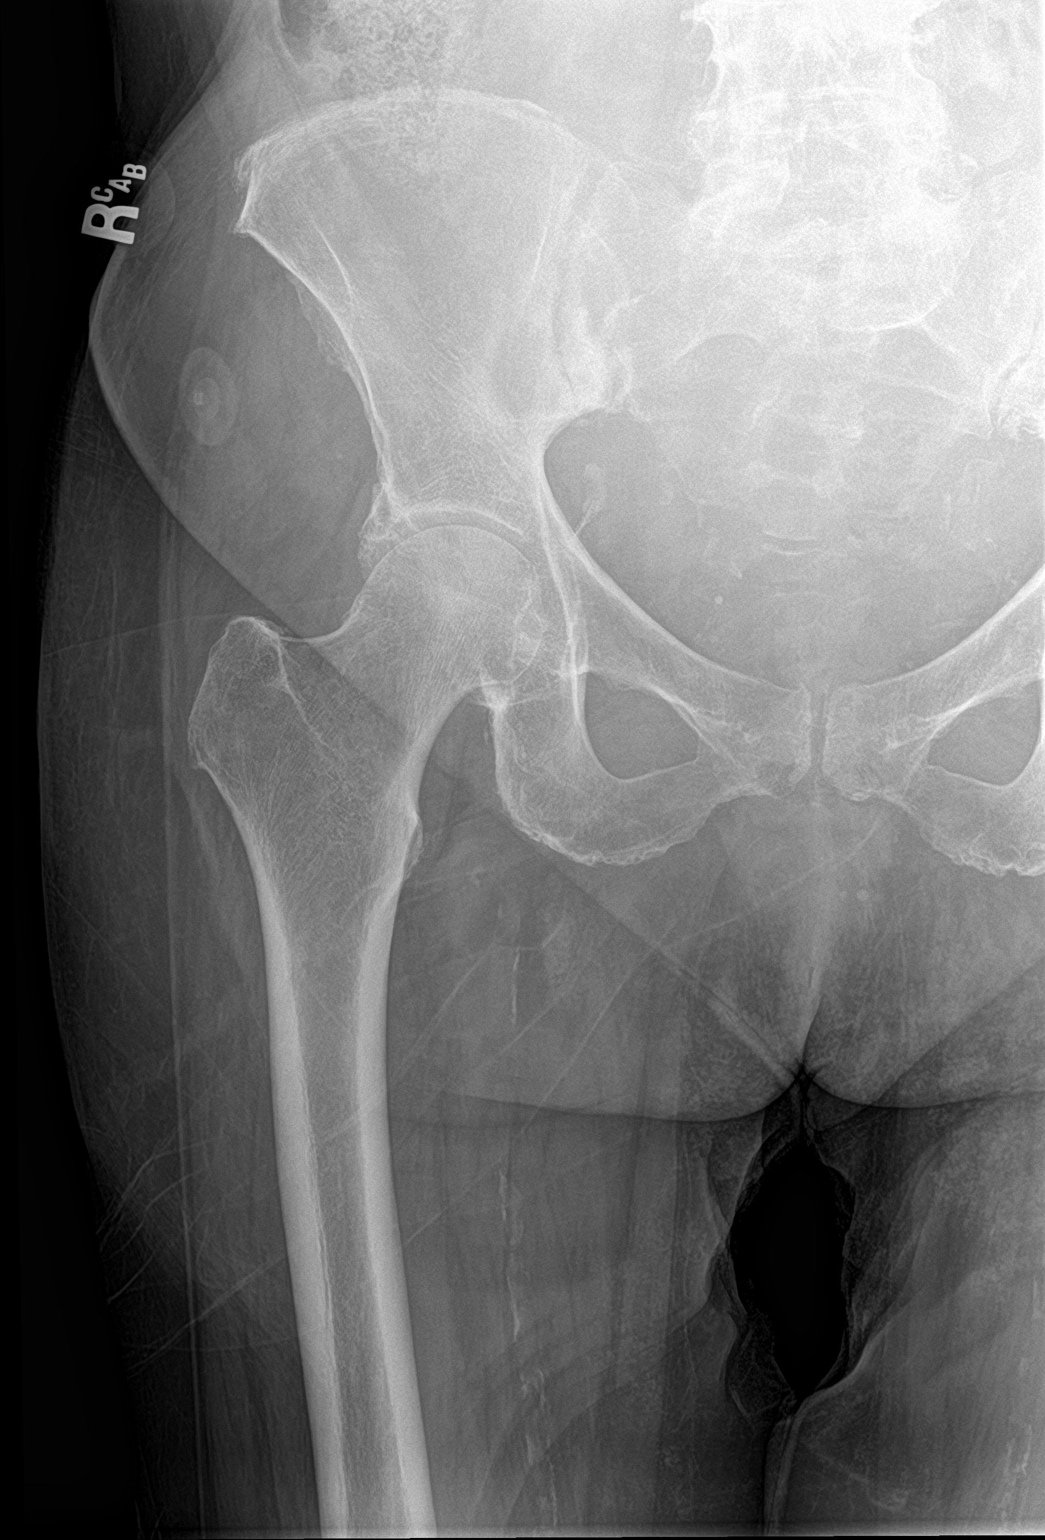

[hip lat]
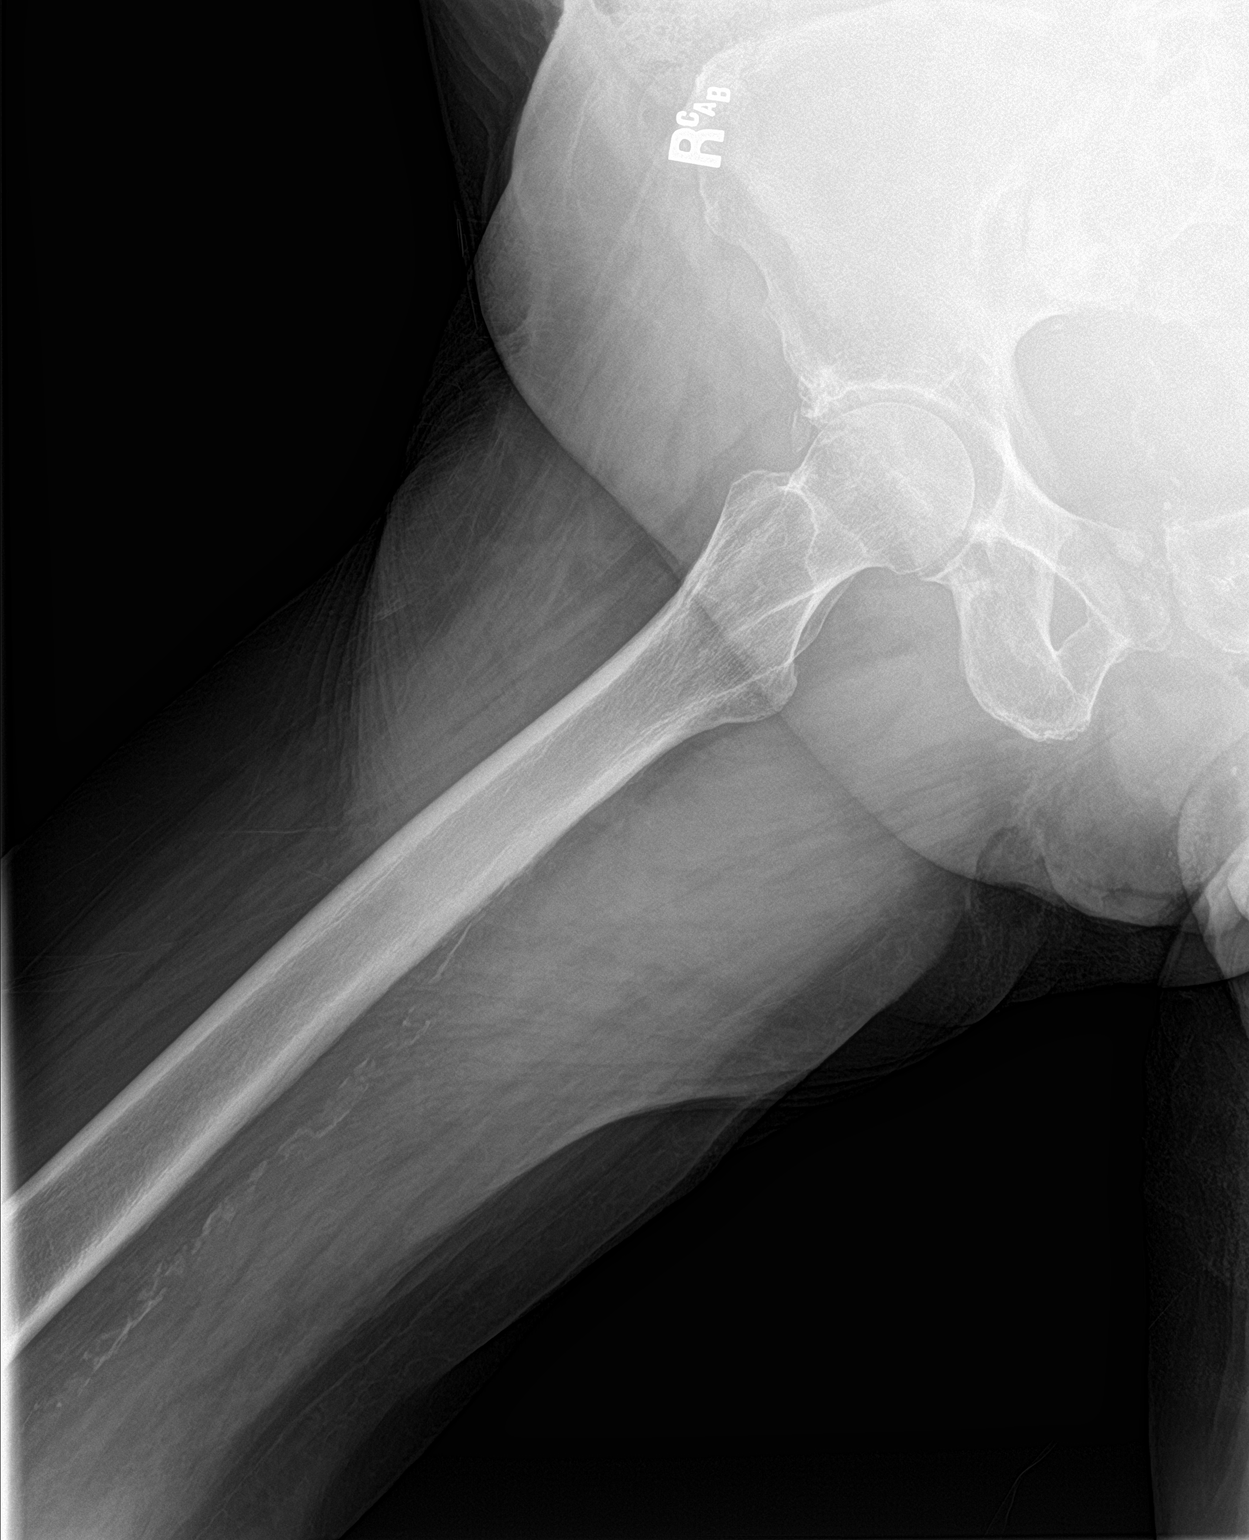

[3 of 3 positions shown; findings below may reference images not displayed]

FINDINGS: Examination demonstrates mild lucency and heterogeneity to the
sacrum compatible with known lymphoma involvement. Sclerosis of the
sacroiliac joints unchanged. Degenerative change of the spine and
hips. No evidence of acute hip fracture or dislocation.
IMPRESSION: No acute findings.

Stable changes over the sacrum compatible with known lymphomatous
involvement.

Degenerative change of the spine, hips and sacroiliac joints.
